# Patient Record
Sex: Female | Born: 1965 | Race: Black or African American | Hispanic: No | Smoking: Never smoker
Health system: Southern US, Community
[De-identification: ages and names within clinical notes are randomized; demographics above are authoritative.]

## PROBLEM LIST (undated history)

## (undated) DIAGNOSIS — T7840XA Allergy, unspecified, initial encounter: Secondary | ICD-10-CM

## (undated) DIAGNOSIS — R7303 Prediabetes: Secondary | ICD-10-CM

## (undated) DIAGNOSIS — E7800 Pure hypercholesterolemia, unspecified: Secondary | ICD-10-CM

## (undated) DIAGNOSIS — B9681 Helicobacter pylori [H. pylori] as the cause of diseases classified elsewhere: Secondary | ICD-10-CM

## (undated) DIAGNOSIS — D649 Anemia, unspecified: Secondary | ICD-10-CM

## (undated) DIAGNOSIS — M2550 Pain in unspecified joint: Secondary | ICD-10-CM

## (undated) DIAGNOSIS — K279 Peptic ulcer, site unspecified, unspecified as acute or chronic, without hemorrhage or perforation: Secondary | ICD-10-CM

## (undated) DIAGNOSIS — G629 Polyneuropathy, unspecified: Secondary | ICD-10-CM

## (undated) HISTORY — DX: Pure hypercholesterolemia, unspecified: E78.00

## (undated) HISTORY — DX: Pain in unspecified joint: M25.50

## (undated) HISTORY — DX: Anemia, unspecified: D64.9

## (undated) HISTORY — DX: Allergy, unspecified, initial encounter: T78.40XA

## (undated) HISTORY — DX: Polyneuropathy, unspecified: G62.9

## (undated) HISTORY — DX: Prediabetes: R73.03

---

## 1998-01-21 ENCOUNTER — Inpatient Hospital Stay (HOSPITAL_COMMUNITY): Admission: AD | Admit: 1998-01-21 | Discharge: 1998-01-21 | Payer: Self-pay | Admitting: *Deleted

## 1998-01-24 ENCOUNTER — Inpatient Hospital Stay (HOSPITAL_COMMUNITY): Admission: AD | Admit: 1998-01-24 | Discharge: 1998-01-24 | Payer: Self-pay | Admitting: *Deleted

## 1998-02-07 ENCOUNTER — Inpatient Hospital Stay (HOSPITAL_COMMUNITY): Admission: AD | Admit: 1998-02-07 | Discharge: 1998-02-07 | Payer: Self-pay | Admitting: *Deleted

## 1998-09-24 ENCOUNTER — Inpatient Hospital Stay (HOSPITAL_COMMUNITY): Admission: AD | Admit: 1998-09-24 | Discharge: 1998-09-24 | Payer: Self-pay | Admitting: *Deleted

## 1998-09-26 ENCOUNTER — Emergency Department (HOSPITAL_COMMUNITY): Admission: EM | Admit: 1998-09-26 | Discharge: 1998-09-27 | Payer: Self-pay

## 1998-12-14 ENCOUNTER — Emergency Department (HOSPITAL_COMMUNITY): Admission: EM | Admit: 1998-12-14 | Discharge: 1998-12-14 | Payer: Self-pay | Admitting: Emergency Medicine

## 1998-12-14 ENCOUNTER — Encounter: Payer: Self-pay | Admitting: Emergency Medicine

## 1999-04-03 ENCOUNTER — Encounter: Payer: Self-pay | Admitting: Emergency Medicine

## 1999-04-03 ENCOUNTER — Emergency Department (HOSPITAL_COMMUNITY): Admission: EM | Admit: 1999-04-03 | Discharge: 1999-04-03 | Payer: Self-pay | Admitting: Emergency Medicine

## 2002-10-24 ENCOUNTER — Emergency Department (HOSPITAL_COMMUNITY): Admission: EM | Admit: 2002-10-24 | Discharge: 2002-10-24 | Payer: Self-pay | Admitting: Emergency Medicine

## 2002-10-24 ENCOUNTER — Encounter: Payer: Self-pay | Admitting: Emergency Medicine

## 2006-06-24 ENCOUNTER — Ambulatory Visit: Payer: Self-pay | Admitting: Nurse Practitioner

## 2006-06-28 ENCOUNTER — Ambulatory Visit: Payer: Self-pay | Admitting: Nurse Practitioner

## 2006-07-01 ENCOUNTER — Ambulatory Visit (HOSPITAL_COMMUNITY): Admission: RE | Admit: 2006-07-01 | Discharge: 2006-07-01 | Payer: Self-pay | Admitting: Family Medicine

## 2006-07-11 ENCOUNTER — Ambulatory Visit: Payer: Self-pay | Admitting: *Deleted

## 2006-11-15 ENCOUNTER — Ambulatory Visit: Payer: Self-pay | Admitting: Nurse Practitioner

## 2006-12-01 ENCOUNTER — Ambulatory Visit: Payer: Self-pay | Admitting: Nurse Practitioner

## 2007-07-19 ENCOUNTER — Encounter (INDEPENDENT_AMBULATORY_CARE_PROVIDER_SITE_OTHER): Payer: Self-pay | Admitting: *Deleted

## 2007-11-07 ENCOUNTER — Inpatient Hospital Stay (HOSPITAL_COMMUNITY): Admission: AD | Admit: 2007-11-07 | Discharge: 2007-11-08 | Payer: Self-pay | Admitting: Obstetrics & Gynecology

## 2007-11-15 ENCOUNTER — Ambulatory Visit (HOSPITAL_COMMUNITY): Admission: RE | Admit: 2007-11-15 | Discharge: 2007-11-15 | Payer: Self-pay | Admitting: *Deleted

## 2007-11-15 ENCOUNTER — Encounter (INDEPENDENT_AMBULATORY_CARE_PROVIDER_SITE_OTHER): Payer: Self-pay | Admitting: *Deleted

## 2008-08-06 ENCOUNTER — Encounter (INDEPENDENT_AMBULATORY_CARE_PROVIDER_SITE_OTHER): Payer: Self-pay | Admitting: Nurse Practitioner

## 2008-08-06 ENCOUNTER — Ambulatory Visit: Payer: Self-pay | Admitting: Internal Medicine

## 2008-08-06 LAB — CONVERTED CEMR LAB
ALT: 18 units/L (ref 0–35)
Albumin: 4.8 g/dL (ref 3.5–5.2)
Barbiturate Quant, Ur: NEGATIVE
Benzodiazepines.: NEGATIVE
CO2: 22 meq/L (ref 19–32)
Chloride: 103 meq/L (ref 96–112)
Creatinine, Ser: 1.01 mg/dL (ref 0.40–1.20)
Creatinine,U: 209.3 mg/dL
Eosinophils Absolute: 0.1 10*3/uL (ref 0.0–0.7)
Glucose, Bld: 75 mg/dL (ref 70–99)
HDL: 57 mg/dL (ref >39)
LDL Cholesterol: 189 mg/dL — ABNORMAL HIGH (ref 0–99)
Lymphocytes Relative: 37 % (ref 12–46)
Methadone: NEGATIVE
Monocytes Absolute: 0.6 10*3/uL (ref 0.1–1.0)
Neutro Abs: 3 10*3/uL (ref 1.7–7.7)
Neutrophils Relative %: 50 % (ref 43–77)
Potassium: 4.2 meq/L (ref 3.5–5.3)
Propoxyphene: NEGATIVE
RDW: 13.8 % (ref 11.5–15.5)
TSH: 0.874 microintl units/mL (ref 0.350–4.50)
Total Bilirubin: 0.4 mg/dL (ref 0.3–1.2)
Total CHOL/HDL Ratio: 4.7
Triglycerides: 113 mg/dL (ref <150)
VLDL: 23 mg/dL (ref 0–40)
WBC: 6 10*3/uL (ref 4.0–10.5)

## 2008-08-07 ENCOUNTER — Encounter (INDEPENDENT_AMBULATORY_CARE_PROVIDER_SITE_OTHER): Payer: Self-pay | Admitting: Nurse Practitioner

## 2009-01-16 ENCOUNTER — Ambulatory Visit: Payer: Self-pay | Admitting: Internal Medicine

## 2009-03-17 ENCOUNTER — Ambulatory Visit: Payer: Self-pay | Admitting: Internal Medicine

## 2009-06-04 ENCOUNTER — Encounter (INDEPENDENT_AMBULATORY_CARE_PROVIDER_SITE_OTHER): Payer: Self-pay | Admitting: Internal Medicine

## 2009-06-04 ENCOUNTER — Ambulatory Visit: Payer: Self-pay | Admitting: Internal Medicine

## 2009-06-04 LAB — CONVERTED CEMR LAB
ALT: 13 units/L (ref 0–35)
BUN: 10 mg/dL (ref 6–23)
CO2: 23 meq/L (ref 19–32)
Creatinine, Ser: 1 mg/dL (ref 0.40–1.20)
Glucose, Bld: 78 mg/dL (ref 70–99)
LDL Cholesterol: 164 mg/dL — ABNORMAL HIGH (ref 0–99)
Potassium: 3.6 meq/L (ref 3.5–5.3)
Total Protein: 7.4 g/dL (ref 6.0–8.3)
Triglycerides: 96 mg/dL (ref <150)
VLDL: 19 mg/dL (ref 0–40)

## 2009-07-15 ENCOUNTER — Ambulatory Visit: Payer: Self-pay | Admitting: Family Medicine

## 2009-07-15 ENCOUNTER — Encounter (INDEPENDENT_AMBULATORY_CARE_PROVIDER_SITE_OTHER): Payer: Self-pay | Admitting: Internal Medicine

## 2009-07-15 LAB — CONVERTED CEMR LAB
Basophils Absolute: 0.1 10*3/uL (ref 0.0–0.1)
Basophils Relative: 1 % (ref 0–1)
HCT: 35.6 % — ABNORMAL LOW (ref 36.0–46.0)
Hemoglobin: 11 g/dL — ABNORMAL LOW (ref 12.0–15.0)
Lymphs Abs: 2.5 10*3/uL (ref 0.7–4.0)
MCV: 80.5 fL (ref 78.0–100.0)
Monocytes Absolute: 0.9 10*3/uL (ref 0.1–1.0)
Neutro Abs: 3.9 10*3/uL (ref 1.7–7.7)
Vit D, 25-Hydroxy: 12 ng/mL — ABNORMAL LOW (ref 30–89)
WBC: 7.3 10*3/uL (ref 4.0–10.5)

## 2009-07-17 ENCOUNTER — Ambulatory Visit: Payer: Self-pay | Admitting: Internal Medicine

## 2009-08-04 ENCOUNTER — Encounter (INDEPENDENT_AMBULATORY_CARE_PROVIDER_SITE_OTHER): Payer: Self-pay | Admitting: Internal Medicine

## 2009-08-04 ENCOUNTER — Ambulatory Visit: Payer: Self-pay | Admitting: Internal Medicine

## 2009-08-04 LAB — CONVERTED CEMR LAB
AST: 19 units/L (ref 0–37)
Albumin: 4.5 g/dL (ref 3.5–5.2)
Alkaline Phosphatase: 51 units/L (ref 39–117)
BUN: 11 mg/dL (ref 6–23)
HDL: 52 mg/dL (ref >39)
LDL Cholesterol: 130 mg/dL — ABNORMAL HIGH (ref 0–99)
Potassium: 4.1 meq/L (ref 3.5–5.3)
Sodium: 139 meq/L (ref 135–145)
Total Bilirubin: 0.3 mg/dL (ref 0.3–1.2)
Total CHOL/HDL Ratio: 4
Triglycerides: 125 mg/dL (ref <150)

## 2009-08-27 ENCOUNTER — Telehealth (INDEPENDENT_AMBULATORY_CARE_PROVIDER_SITE_OTHER): Payer: Self-pay | Admitting: *Deleted

## 2009-08-27 ENCOUNTER — Ambulatory Visit: Payer: Self-pay | Admitting: Internal Medicine

## 2009-09-12 ENCOUNTER — Encounter (INDEPENDENT_AMBULATORY_CARE_PROVIDER_SITE_OTHER): Payer: Self-pay | Admitting: Internal Medicine

## 2009-09-12 ENCOUNTER — Ambulatory Visit: Payer: Self-pay | Admitting: Internal Medicine

## 2009-09-12 ENCOUNTER — Ambulatory Visit (HOSPITAL_COMMUNITY): Admission: RE | Admit: 2009-09-12 | Discharge: 2009-09-12 | Payer: Self-pay | Admitting: Internal Medicine

## 2009-09-12 LAB — CONVERTED CEMR LAB
Basophils Absolute: 0.1 10*3/uL (ref 0.0–0.1)
Eosinophils Relative: 2 % (ref 0–5)
HCT: 36.6 % (ref 36.0–46.0)
Lymphocytes Relative: 19 % (ref 12–46)
Lymphs Abs: 1.4 10*3/uL (ref 0.7–4.0)
Monocytes Absolute: 1.2 10*3/uL — ABNORMAL HIGH (ref 0.1–1.0)
Neutro Abs: 4.8 10*3/uL (ref 1.7–7.7)
RBC: 4.6 M/uL (ref 3.87–5.11)
RDW: 17.5 % — ABNORMAL HIGH (ref 11.5–15.5)
WBC: 7.6 10*3/uL (ref 4.0–10.5)

## 2009-12-03 ENCOUNTER — Emergency Department (HOSPITAL_COMMUNITY): Admission: EM | Admit: 2009-12-03 | Discharge: 2009-12-03 | Payer: Self-pay | Admitting: Emergency Medicine

## 2010-01-02 ENCOUNTER — Ambulatory Visit: Payer: Self-pay | Admitting: Family Medicine

## 2010-01-05 ENCOUNTER — Ambulatory Visit (HOSPITAL_COMMUNITY): Admission: RE | Admit: 2010-01-05 | Discharge: 2010-01-05 | Payer: Self-pay | Admitting: Family Medicine

## 2010-04-10 ENCOUNTER — Ambulatory Visit: Payer: Self-pay | Admitting: Family Medicine

## 2010-05-13 ENCOUNTER — Encounter (INDEPENDENT_AMBULATORY_CARE_PROVIDER_SITE_OTHER): Payer: Self-pay | Admitting: Adult Health

## 2010-05-13 ENCOUNTER — Ambulatory Visit: Payer: Self-pay | Admitting: Internal Medicine

## 2010-05-13 LAB — CONVERTED CEMR LAB
ALT: 15 units/L (ref 0–35)
BUN: 8 mg/dL (ref 6–23)
Basophils Absolute: 0.1 10*3/uL (ref 0.0–0.1)
CO2: 23 meq/L (ref 19–32)
Chloride: 106 meq/L (ref 96–112)
Cholesterol: 243 mg/dL — ABNORMAL HIGH (ref 0–200)
Creatinine, Ser: 1 mg/dL (ref 0.40–1.20)
Eosinophils Relative: 2 % (ref 0–5)
Glucose, Bld: 77 mg/dL (ref 70–99)
HCT: 34.8 % — ABNORMAL LOW (ref 36.0–46.0)
Hemoglobin: 10.8 g/dL — ABNORMAL LOW (ref 12.0–15.0)
LDL Cholesterol: 158 mg/dL — ABNORMAL HIGH (ref 0–99)
Lymphocytes Relative: 41 % (ref 12–46)
Lymphs Abs: 1.9 10*3/uL (ref 0.7–4.0)
Monocytes Absolute: 0.5 10*3/uL (ref 0.1–1.0)
Monocytes Relative: 11 % (ref 3–12)
Neutrophils Relative %: 45 % (ref 43–77)
Platelets: 417 10*3/uL — ABNORMAL HIGH (ref 150–400)
RDW: 15.4 % (ref 11.5–15.5)
Total Bilirubin: 0.3 mg/dL (ref 0.3–1.2)
Total CHOL/HDL Ratio: 4
Total Protein: 7.3 g/dL (ref 6.0–8.3)
Triglycerides: 122 mg/dL (ref <150)

## 2010-06-19 ENCOUNTER — Emergency Department (HOSPITAL_COMMUNITY): Admission: EM | Admit: 2010-06-19 | Discharge: 2010-06-20 | Payer: Self-pay | Admitting: Emergency Medicine

## 2010-06-26 ENCOUNTER — Ambulatory Visit: Payer: Self-pay | Admitting: Internal Medicine

## 2010-06-26 LAB — CONVERTED CEMR LAB: Helicobacter Pylori Antibody-IgG: 7.1 — ABNORMAL HIGH

## 2010-07-10 ENCOUNTER — Encounter (INDEPENDENT_AMBULATORY_CARE_PROVIDER_SITE_OTHER): Payer: Self-pay | Admitting: Family Medicine

## 2010-07-10 ENCOUNTER — Ambulatory Visit: Payer: Self-pay | Admitting: Adult Health

## 2010-07-10 LAB — CONVERTED CEMR LAB
ALT: 23 units/L (ref 0–35)
Alkaline Phosphatase: 45 units/L (ref 39–117)
BUN: 9 mg/dL (ref 6–23)
Chloride: 101 meq/L (ref 96–112)
Creatinine, Ser: 0.98 mg/dL (ref 0.40–1.20)
HDL: 66 mg/dL (ref >39)
LDL Cholesterol: 67 mg/dL (ref 0–99)
Total Bilirubin: 0.4 mg/dL (ref 0.3–1.2)
Triglycerides: 93 mg/dL (ref <150)

## 2010-11-22 ENCOUNTER — Encounter: Payer: Self-pay | Admitting: *Deleted

## 2011-01-15 LAB — CBC
Hemoglobin: 14 g/dL (ref 12.0–15.0)
MCH: 26.5 pg (ref 26.0–34.0)
MCV: 81.7 fL (ref 78.0–100.0)
Platelets: 336 10*3/uL (ref 150–400)
RBC: 5.28 MIL/uL — ABNORMAL HIGH (ref 3.87–5.11)
WBC: 12 10*3/uL — ABNORMAL HIGH (ref 4.0–10.5)

## 2011-01-15 LAB — FECAL LACTOFERRIN, QUANT

## 2011-01-15 LAB — BASIC METABOLIC PANEL
Calcium: 9.6 mg/dL (ref 8.4–10.5)
Creatinine, Ser: 1 mg/dL (ref 0.4–1.2)
GFR calc Af Amer: >60 mL/min (ref >60)

## 2011-01-15 LAB — DIFFERENTIAL
Basophils Absolute: 0.1 10*3/uL (ref 0.0–0.1)
Eosinophils Absolute: 0 10*3/uL (ref 0.0–0.7)
Eosinophils Relative: 0 % (ref 0–5)
Lymphs Abs: 1.4 10*3/uL (ref 0.7–4.0)
Monocytes Absolute: 0.6 10*3/uL (ref 0.1–1.0)
Monocytes Relative: 5 % (ref 3–12)

## 2011-01-15 LAB — CLOSTRIDIUM DIFFICILE EIA: C difficile Toxins A+B, EIA: NEGATIVE

## 2011-01-15 LAB — PREGNANCY, URINE: Preg Test, Ur: NEGATIVE

## 2011-01-15 LAB — STOOL CULTURE

## 2011-01-15 LAB — OVA AND PARASITE EXAMINATION

## 2011-01-15 LAB — HEPATIC FUNCTION PANEL: Albumin: 4.8 g/dL (ref 3.5–5.2)

## 2011-03-16 NOTE — Op Note (Signed)
Cheryl Graham, Cheryl Graham            ACCOUNT NO.:  [id number]   MEDICAL RECORD NO.:  [id number]          PATIENT TYPE:  AMB   LOCATION:  SDC                           FACILITY:  [facility]   PHYSICIAN:  Almedia Balls. Fore, M.D.   DATE OF BIRTH:  October 12, 1966   DATE OF PROCEDURE:  11/15/2007  DATE OF DISCHARGE:                               OPERATIVE REPORT   PREOPERATIVE DIAGNOSIS:  Vaginal septum, abnormal uterine bleeding.   POSTOPERATIVE DIAGNOSIS:  Vaginal septum, abnormal uterine bleeding,  pending pathology.   OPERATION:  Excision of vaginal septum, D&C.   ANESTHESIA:  General orotracheal plus 10 mL 1% lidocaine with 1:100,000  epinephrine local and paracervical block.   INDICATIONS FOR SURGERY:  The patient is a 45 year old with the above-  noted problems who was counseled as to the need for surgery to treat  these problems and the type of procedures to be performed.  She was also  counseled fully as to the risks involved to include risks of anesthesia,  injury to uterus, tubes, ovaries, bowel, bladder, blood vessels,  ureters, postoperative hemorrhage, infection, recuperation.  She fully  understands all these considerations and has signed informed consent to  proceed on November 15, 2007.   OPERATIVE FINDINGS:  On speculum exam, there was noted to be of vaginal  septum extending from the midportion of the upper vagina over to the  left side which completely obscured the cervix.  The cervix was likewise  in an unusual position with very firm connective tissue directing it in  a posterior direction.  The uterus sounded to 7.5 cm.  It was not  possible to perform hysteroscopy because of the positioning of the  cervix and the inability to insert the hysteroscope through the cervix.   PROCEDURE:  With the patient under general anesthesia, prepared and  draped in the usual sterile fashion, speculum was placed in the vagina.  The septum was grasped with an Allis clamp, and a solution of 1%  lidocaine with 1:100,000 epinephrine.  It was injected at the base and  around the septum.  This area was then excised and then the open defect  was suture ligated open rendering it hemostatic but with release of the  vagina in this area.  It was quite difficult to place a tenaculum on the  cervix, but this was done after much manipulation of the upper vaginal  tissue which was quite firm.  The cervix was then elevated slightly to  the point where a sound and dilating could be performed on the cervix.  An endometrial curetting was done with a small sharp curette with  moderate amount of tissue being obtained.  At this point, the area of  the septum was treated with Monsel's solution and observed for several  minutes to ensure that all was hemostatic.  After noting that this was  the case and that sponge and instrument counts were correct, the  procedure was terminated.   ESTIMATED BLOOD LOSS:  50 mL.   The patient was taken to the recovery room in good condition following  catheterization and free flow of clear  urine.   FOLLOW-UP CARE:  She is to return to the office in two weeks for follow-  up and to call if heavy bleeding, pain or unexplained fever should  ensue.  She was given a prescription for doxycycline 100 mg #12 to be  taken one b.i.d. and was advised to use over-the-counter medications  such as Advil or Tylenol for pain.           ______________________________  Almedia Balls. Randell Patient, M.D.     SRF/MEDQ  D:  11/15/2007  T:  11/15/2007  Job:  [id number]

## 2011-07-22 LAB — GC/CHLAMYDIA PROBE AMP, GENITAL
Chlamydia, DNA Probe: NEGATIVE
GC Probe Amp, Genital: NEGATIVE

## 2011-07-22 LAB — CBC
Hemoglobin: 11.8 — ABNORMAL LOW
MCHC: 34
MCV: 83.5
Platelets: 377
Platelets: 389
RDW: 16.8 — ABNORMAL HIGH
WBC: 5.3
WBC: 7.7

## 2011-07-22 LAB — WET PREP, GENITAL: Clue Cells Wet Prep HPF POC: NONE SEEN

## 2013-06-26 ENCOUNTER — Encounter (HOSPITAL_COMMUNITY): Payer: Self-pay | Attending: Hematology and Oncology

## 2013-06-26 ENCOUNTER — Encounter (HOSPITAL_COMMUNITY): Payer: Self-pay

## 2013-06-26 VITALS — BP 109/76 | HR 75 | Temp 97.9°F | Resp 16 | Ht 62.5 in | Wt 138.3 lb

## 2013-06-26 DIAGNOSIS — D75839 Thrombocytosis, unspecified: Secondary | ICD-10-CM

## 2013-06-26 DIAGNOSIS — D649 Anemia, unspecified: Secondary | ICD-10-CM

## 2013-06-26 DIAGNOSIS — N920 Excessive and frequent menstruation with regular cycle: Secondary | ICD-10-CM

## 2013-06-26 DIAGNOSIS — D473 Essential (hemorrhagic) thrombocythemia: Secondary | ICD-10-CM

## 2013-06-26 LAB — CBC WITH DIFFERENTIAL/PLATELET
HCT: 34.1 % — ABNORMAL LOW (ref 36.0–46.0)
Hemoglobin: 10.6 g/dL — ABNORMAL LOW (ref 12.0–15.0)
Lymphs Abs: 2.2 10*3/uL (ref 0.7–4.0)
Monocytes Absolute: 0.4 10*3/uL (ref 0.1–1.0)
Monocytes Relative: 8 % (ref 3–12)
Neutro Abs: 2.8 10*3/uL (ref 1.7–7.7)
Neutrophils Relative %: 50 % (ref 43–77)
RBC: 4.62 MIL/uL (ref 3.87–5.11)

## 2013-06-26 LAB — COMPREHENSIVE METABOLIC PANEL
Albumin: 4 g/dL (ref 3.5–5.2)
Alkaline Phosphatase: 57 U/L (ref 39–117)
BUN: 10 mg/dL (ref 6–23)
CO2: 28 mEq/L (ref 19–32)
Chloride: 97 mEq/L (ref 96–112)
GFR calc Af Amer: 77 mL/min — ABNORMAL LOW (ref >90)
GFR calc non Af Amer: 66 mL/min — ABNORMAL LOW (ref >90)
Glucose, Bld: 107 mg/dL — ABNORMAL HIGH (ref 70–99)
Potassium: 3.5 mEq/L (ref 3.5–5.1)
Total Bilirubin: 0.2 mg/dL — ABNORMAL LOW (ref 0.3–1.2)

## 2013-06-26 LAB — IRON AND TIBC
Iron: 33 ug/dL — ABNORMAL LOW (ref 42–135)
UIBC: 434 ug/dL — ABNORMAL HIGH (ref 125–400)

## 2013-06-26 LAB — LACTATE DEHYDROGENASE: LDH: 202 U/L (ref 94–250)

## 2013-06-26 NOTE — Progress Notes (Addendum)
Patient History and Physical   Cheryl Graham [id number] Mar 12, 1966 47 y.o. 06/26/2013  Referring IO:[id number] Katherene Ponto PA-C  Chief Complaint: Anemia and elevated platelet count   HPI:  Cheryl Graham is a 47 year old woman with history of heavy menstrual bleed which dates back to at the least 2007. She had pelvic and vaginal ultrasound on 07/01/2006 which showed normal-appearing uterus with normal endometrium in addition to normal ovaries.  There was no report of fibroid. On 11/15/2007 patient that the excision of vaginal septum/D&C for abnormal uterine bleeding. I do not have any iron study result at this time in her available records. Review of her paper records and especially historical CBC results shows more recently on 06/01/2013 hemoglobin was noted to be 9.6 with platelet count of 761K.  In January of 2014 hemoglobin was 9.9 with platelets of 510 K.  In April of 2013 hemoglobin was again no 9.9 with normal platelet count 344K, at all times  WBC has remained normal.  She reports that she is taking iron but was not able to tell me the strength of the pill that she takes it at a rate of one tablet a day and has not been recently taking it due to complaints of constipation.  She also reports irregular menstrual periods . Her last menstrual period was in June 2014.  She states that ,usually when she does have her periods, it  can last up to 2 weeks with clots and particularly even in the first 3 days.  She feels very tired but denied any craving for ice or shortness of breath.  She denies any black stool or bloody stool.  She did state that sometimes she feels cold.  PMH: Past Medical History  Diagnosis Date  . Anemia   . Hypercholesteremia     History reviewed. No pertinent past surgical history.  Allergies: Allergies no known allergies  Medications: Current outpatient prescriptions:acetaminophen (TYLENOL) 500 MG tablet, Take 500 mg by mouth as needed for pain., Disp: , Rfl: ;   ferrous sulfate 324 (65 FE) MG TBEC, Take 1 tablet by mouth daily., Disp: , Rfl: ;  ibuprofen (ADVIL,MOTRIN) 200 MG tablet, Take 200 mg by mouth as needed for pain., Disp: , Rfl: ;  pseudoephedrine-acetaminophen (TYLENOL SINUS) 30-500 MG TABS, Take 1 tablet by mouth as needed., Disp: , Rfl:  simvastatin (ZOCOR) 20 MG tablet, Take 20 mg by mouth every evening., Disp: , Rfl:    Social History:   reports that she has never smoked. She has never used smokeless tobacco. She reports that she does not drink alcohol or use illicit drugs.  Works as Water engineer. Single. No children. Lives alone.  Family History: Family History  Problem Relation Age of Onset  . Stroke Mother   . Diabetes Father   Denies any family history of  hematologic condition. Cousin had lupus.  Review of Systems: 14 point review of system is as in the history above otherwise negative.  Physical Exam: Blood pressure 109/76, pulse 75, temperature 97.9 F (36.6 C), temperature source Oral, resp. rate 16, height 5' 2.5" (1.588 m), weight 138 lb 4.8 oz (62.732 kg). GENERAL: No distress, .  SKIN:  No rashes or significant lesions , no ecchymosis or petechia or rash. HEAD: Normocephalic, No masses, lesions, tenderness or abnormalities  EYES: Conjunctiva are pink , non-injected and no jaundice. LYMPH: No palpable lymphadenopathy,  In the neck supraclavicular area or axilla. LUNGS: Clear to auscultation , no crackles or wheezes HEART: regular rate &  rhythm, no murmurs, no gallops, S1 normal and S2 normal  ABDOMEN: Abdomen soft, non-tender, normal bowel sounds, no masses or organomegaly and no hepatosplenomegaly palpable.  EXTREMITIES: No edema, no skin discoloration or tenderness.      Lab Results: Lab Results  Component Value Date   WBC 12.0* 06/19/2010   HGB 14.0 06/19/2010   HCT 43.1 06/19/2010   MCV 81.7 06/19/2010   PLT 336 06/19/2010     Chemistry      Component Value Date/Time   NA 139 07/10/2010 1927   K 5.0  07/10/2010 1927   CL 101 07/10/2010 1927   CO2 24 07/10/2010 1927   BUN 9 07/10/2010 1927   CREATININE 0.98 07/10/2010 1927      Component Value Date/Time   CALCIUM 9.6 07/10/2010 1927   ALKPHOS 45 07/10/2010 1927   AST 34 07/10/2010 1927   ALT 23 07/10/2010 1927   BILITOT 0.4 07/10/2010 1927      Impression: Cheryl Graham has features suggestive of iron deficiency anemia.  However this needs confirmation.  Thrombocytosis most likely is from iron deficiency.  I suspect  from her history the patient has a GYN related blood loss.  It's been several years since the patient's last  vaginal ultrasound and I think that a repeat will be reasonable if patient is established to be iron deficient. I had a very long discussion with her and we discussed the causes of iron deficiency including but not limited to GYN/ GI causes.  Thrombocytosis most likely secondary to iron deficiency and unless this does not improve or resolve with correction of iron deficiency if established, will further work up in this regard be warranted.   Recommendations: 1. CBC/CMP/iron studies today. 2.  I will review peripheral blood smear. 3.  We will call and schedule patient for IV iron if indicated based on today's blood work since she is unable to tolerate oral iron. 4.  She'll return to clinic in 2 weeks to discuss today's results and possibly at that time discuss further evaluation in terms of etiology of iron deficiency (if established by today's blood work)     All questions were satisfactorily answered.She knows to call if she has any concern.  I spent 50% of the time was spent counseling the patient face to face. The total time spent in the appointment was 60 minutes.   Sherral Hammers, MD FACP. Hematology/Oncology.    Addendum: Review of peripheral blood smear showed hypochromia, and anisopoikilocytosis, microcytosis and mature neutrophils. No  Blast forms seen.  Marland Kitchen

## 2013-06-26 NOTE — Patient Instructions (Addendum)
[facility] Discharge Instructions  RECOMMENDATIONS MADE BY THE CONSULTANT AND ANY TEST RESULTS WILL BE SENT TO YOUR REFERRING PHYSICIAN.  Lab work today. We will call you if you are in need of an Iron infusion. Return to clinic in 2 weeks to see MD to review all lab results.  Thank you for choosing [facility] to provide your oncology and hematology care.  To afford each patient quality time with our providers, please arrive at least 15 minutes before your scheduled appointment time.  With your help, our goal is to use those 15 minutes to complete the necessary work-up to ensure our physicians have the information they need to help with your evaluation and healthcare recommendations.    Effective January 1st, 2014, we ask that you re-schedule your appointment with our physicians should you arrive 10 or more minutes late for your appointment.  We strive to give you quality time with our providers, and arriving late affects you and other patients whose appointments are after yours.    Again, thank you for choosing [facility].  Our hope is that these requests will decrease the amount of time that you wait before being seen by our physicians.       _____________________________________________________________  Should you have questions after your visit to [facility], please contact our office at [phone] between the hours of 8:30 a.m. and 5:00 p.m.  Voicemails left after 4:30 p.m. will not be returned until the following business day.  For prescription refill requests, have your pharmacy contact our office with your prescription refill request.

## 2013-06-28 ENCOUNTER — Encounter (HOSPITAL_BASED_OUTPATIENT_CLINIC_OR_DEPARTMENT_OTHER): Payer: Self-pay

## 2013-06-28 VITALS — BP 116/80 | HR 85 | Temp 98.4°F | Resp 18

## 2013-06-28 DIAGNOSIS — N920 Excessive and frequent menstruation with regular cycle: Secondary | ICD-10-CM

## 2013-06-28 DIAGNOSIS — D509 Iron deficiency anemia, unspecified: Secondary | ICD-10-CM

## 2013-06-28 DIAGNOSIS — D649 Anemia, unspecified: Secondary | ICD-10-CM

## 2013-06-28 MED ORDER — FERUMOXYTOL INJECTION 510 MG/17 ML
1020.0000 mg | Freq: Once | INTRAVENOUS | Status: AC
  Administered 2013-06-28: 1020 mg via INTRAVENOUS
  Filled 2013-06-28: qty 34

## 2013-06-28 MED ORDER — SODIUM CHLORIDE 0.9 % IV SOLN
Freq: Once | INTRAVENOUS | Status: AC
  Administered 2013-06-28: 11:00:00 via INTRAVENOUS

## 2013-06-28 NOTE — Progress Notes (Signed)
Tolerated well

## 2013-07-09 ENCOUNTER — Encounter (HOSPITAL_COMMUNITY): Payer: Self-pay | Attending: Hematology and Oncology

## 2013-07-09 DIAGNOSIS — D509 Iron deficiency anemia, unspecified: Secondary | ICD-10-CM | POA: Insufficient documentation

## 2013-07-09 DIAGNOSIS — D649 Anemia, unspecified: Secondary | ICD-10-CM

## 2013-07-09 DIAGNOSIS — D75839 Thrombocytosis, unspecified: Secondary | ICD-10-CM

## 2013-07-09 DIAGNOSIS — D473 Essential (hemorrhagic) thrombocythemia: Secondary | ICD-10-CM | POA: Insufficient documentation

## 2013-07-09 LAB — CBC WITH DIFFERENTIAL/PLATELET
Basophils Absolute: 0.1 10*3/uL (ref 0.0–0.1)
HCT: 35.5 % — ABNORMAL LOW (ref 36.0–46.0)
Lymphocytes Relative: 39 % (ref 12–46)
Monocytes Absolute: 0.7 10*3/uL (ref 0.1–1.0)
Neutro Abs: 3.2 10*3/uL (ref 1.7–7.7)
RBC: 4.71 MIL/uL (ref 3.87–5.11)
RDW: 22.4 % — ABNORMAL HIGH (ref 11.5–15.5)
WBC: 6.5 10*3/uL (ref 4.0–10.5)

## 2013-07-09 LAB — IRON AND TIBC
Iron: 298 ug/dL — ABNORMAL HIGH (ref 42–135)
Saturation Ratios: 75 % — ABNORMAL HIGH (ref 20–55)
UIBC: 100 ug/dL — ABNORMAL LOW (ref 125–400)

## 2013-07-09 NOTE — Progress Notes (Signed)
Labs drawn today for cbc/diff,Iron and IBC,ferr 

## 2013-07-10 ENCOUNTER — Ambulatory Visit (HOSPITAL_COMMUNITY): Payer: Self-pay

## 2013-07-12 ENCOUNTER — Encounter (HOSPITAL_BASED_OUTPATIENT_CLINIC_OR_DEPARTMENT_OTHER): Payer: Self-pay

## 2013-07-12 ENCOUNTER — Encounter (HOSPITAL_COMMUNITY): Payer: Self-pay

## 2013-07-12 VITALS — BP 99/67 | HR 87 | Temp 98.4°F | Resp 16 | Wt 136.7 lb

## 2013-07-12 DIAGNOSIS — R109 Unspecified abdominal pain: Secondary | ICD-10-CM

## 2013-07-12 DIAGNOSIS — D509 Iron deficiency anemia, unspecified: Secondary | ICD-10-CM

## 2013-07-12 DIAGNOSIS — N920 Excessive and frequent menstruation with regular cycle: Secondary | ICD-10-CM

## 2013-07-12 NOTE — Progress Notes (Signed)
[facility] [phone]   [phone]  OFFICE PROGRESS NOTE  No primary provider on file. No primary provider on file.  DIAGNOSIS: Iron deficiency anemia.  INTERVAL HISTORY:   Cheryl Graham is a 47 year old woman with history of heavy menstrual bleed which dates back to at the least 2007. She had pelvic and vaginal ultrasound on 07/01/2006 which showed normal-appearing uterus with normal endometrium in addition to normal ovaries. There was no report of fibroid. On 11/15/2007 patient that the excision of vaginal septum/D&C for abnormal uterine bleeding. Following her initial visit, review of peripheral blood smear showed hypochromia, and anisopoikilocytosis, microcytosis and mature neutrophils. No Blast forms seen. Ferritin was 54 and TIBC was elevated at 467 with saturation of 7%. This picture was consistent with iron deficiency anemia. As a result I ordered IV iron which was administered on 06/28/2013.  Since receiving IV iron, she tells me that she's been feeling better and less fatigued. She returns to the clinic today for scheduled follow up discuss and initiate her results.  She reports that she's been having nausea or vomiting [place] to more pain since the past Monday.  She also reports muscle aches and has been noticing these symptoms since she began a new cholesterol medication (simvastatin) which she reportedly did not tolerate well in the past.   MEDICAL HISTORY: Past Medical History  Diagnosis Date  . Anemia   . Hypercholesteremia     ALLERGIES:  has No Known Allergies.  MEDICATIONS:  Current Outpatient Prescriptions  Medication Sig Dispense Refill  . acetaminophen (TYLENOL) 500 MG tablet Take 500 mg by mouth as needed for pain.      . ferrous sulfate 324 (65 FE) MG TBEC Take 1 tablet by mouth daily.      Marland Kitchen ibuprofen (ADVIL,MOTRIN) 200 MG tablet Take 200 mg by mouth as needed for pain.      . pseudoephedrine-acetaminophen (TYLENOL SINUS)  30-500 MG TABS Take 1 tablet by mouth as needed.      . simvastatin (ZOCOR) 20 MG tablet Take 20 mg by mouth every evening.       No current facility-administered medications for this visit.    SURGICAL HISTORY: History reviewed. No pertinent past surgical history.   REVIEW OF SYSTEMS:14 point review of system is as in the history above otherwise negative.  PHYSICAL EXAMINATION:  Blood pressure 99/67, pulse 87, temperature 98.4 F (36.9 C), temperature source Oral, resp. rate 16, weight 136 lb 11.2 oz (62.007 kg). GENERAL: No acute distress. EYES: Conjunctiva are pink and non-injected and no jaundice LUNGS: Clear to auscultation , no crackles or wheezes HEART: regular rate & rhythm, no murmurs, no gallops, S1 normal and S2 normal and no S3. ABDOMEN: Abdomen soft, no significant tenderness, no masses or organomegaly and no hepatosplenomegaly palpable EXTREMITIES: No edema, no skin discoloration or tenderness NEURO: Alert & oriented      LABORATORY DATA: Lab Results  Component Value Date   WBC 6.5 07/09/2013   HGB 11.4* 07/09/2013   HCT 35.5* 07/09/2013   MCV 75.4* 07/09/2013   PLT 368 07/09/2013  Results for Cheryl Graham, Cheryl Graham (MRN [id number]) as of 07/12/2013 18:35  Ref. Range 06/26/2013 12:22  WBC Latest Range: 4.0-10.5 K/uL 5.6  RBC Latest Range: 3.87-5.11 MIL/uL 4.62  Hemoglobin Latest Range: 12.0-15.0 g/dL 95.1 (L)  HCT Latest Range: 36.0-46.0 % 34.1 (L)  MCV Latest Range: 78.0-100.0 fL 73.8 (L)  MCH Latest Range: 26.0-34.0 pg 22.9 (L)  MCHC Latest  Range: 30.0-36.0 g/dL 16.1  RDW Latest Range: 11.5-15.5 % 22.6 (H)  Platelets Latest Range: 150-400 K/uL 396  Results for Cheryl Graham, Cheryl Graham (MRN [id number]) as of 07/12/2013 18:35Results for Cheryl Graham, Cheryl Graham (MRN [id number]) as of 07/12/2013 18:35  Ref. Range 06/26/2013 12:22  Iron Latest Range: 42-135 ug/dL 33 (L)  UIBC Latest Range: 125-400 ug/dL 956 (H)  TIBC Latest Range: 250-470 ug/dL 213  Saturation Ratios Latest Range: 20-55  % 7 (L)  Ferritin Latest Range: 10-291 ng/mL 54    Ref. Range 07/09/2013 09:49  Iron Latest Range: 42-135 ug/dL 086 (H)  UIBC Latest Range: 125-400 ug/dL 578 (L)  TIBC Latest Range: 250-470 ug/dL 469  Saturation Ratios Latest Range: 20-55 % 75 (H)  Ferritin Latest Range: 10-291 ng/mL 1607 (H)      Chemistry      Component Value Date/Time   NA 136 06/26/2013 1222   K 3.5 06/26/2013 1222   CL 97 06/26/2013 1222   CO2 28 06/26/2013 1222   BUN 10 06/26/2013 1222   CREATININE 1.01 06/26/2013 1222      Component Value Date/Time   CALCIUM 9.4 06/26/2013 1222   ALKPHOS 57 06/26/2013 1222   AST 20 06/26/2013 1222   ALT 14 06/26/2013 1222   BILITOT 0.2* 06/26/2013 1222       RADIOGRAPHIC STUDIES: No results found.   ASSESSMENT:  Iron deficiency anemia.  Patient is showing good response to iron infusion. She needs workup for etiology of iron deficiency. She has a complaint of abdominal symptoms and musculoskeletal pain which she attributes to her cholesterol medication which she reportedly did not tolerate well in the past.    Recommendations :  1. GYN referral for evaluation of GYN etiology of the heavy bleeding and iron deficiency. There is no strong evidence that point to GI blood loss at this time. If GYN cause is excluded and iron deficiency reoccurs , then GI evaluation with colonoscopy be reasonable. 2. Return to clinic in 3 months and we will repeat iron studies at that time. 3. Patient instructed to call her primary care provider and reports symptoms that she attributes to the cholesterol medication.  In the meantime I advised her to stop the medication until she get clearance from her primary care provider.     All questions were satisfactorily answered. Patient knows to call if  any concern arises.  I spent more than 50 % counseling the patient face to face. The total time spent in the appointment was 30 minutes.   Sherral Hammers, MD FACP. Hematology/Oncology.

## 2013-07-12 NOTE — Patient Instructions (Addendum)
[facility] Discharge Instructions  RECOMMENDATIONS MADE BY THE CONSULTANT AND ANY TEST RESULTS WILL BE SENT TO YOUR REFERRING PHYSICIAN.  EXAM FINDINGS BY THE PHYSICIAN TODAY AND SIGNS OR SYMPTOMS TO REPORT TO CLINIC OR PRIMARY PHYSICIAN: Exam and discussion by Dr. Sharia Reeve.  Your iron studies are improving.  MD recommends that you be seen by a gynecologist to make sure there is no GYN reason for the iron loss.  MEDICATIONS PRESCRIBED:  none  INSTRUCTIONS GIVEN AND DISCUSSED: Report increased fatique, increased ice intake, increased shortness of breath.  SPECIAL INSTRUCTIONS/FOLLOW-UP: Follow-up in 3 months with blood work and follow-up.  Thank you for choosing [facility] to provide your oncology and hematology care.  To afford each patient quality time with our providers, please arrive at least 15 minutes before your scheduled appointment time.  With your help, our goal is to use those 15 minutes to complete the necessary work-up to ensure our physicians have the information they need to help with your evaluation and healthcare recommendations.    Effective January 1st, 2014, we ask that you re-schedule your appointment with our physicians should you arrive 10 or more minutes late for your appointment.  We strive to give you quality time with our providers, and arriving late affects you and other patients whose appointments are after yours.    Again, thank you for choosing [facility].  Our hope is that these requests will decrease the amount of time that you wait before being seen by our physicians.       _____________________________________________________________  Should you have questions after your visit to [facility], please contact our office at [phone] between the hours of 8:30 a.m. and 5:00 p.m.  Voicemails left after 4:30 p.m. will not be returned until the following business day.  For prescription refill  requests, have your pharmacy contact our office with your prescription refill request.

## 2013-07-14 ENCOUNTER — Emergency Department (HOSPITAL_COMMUNITY)
Admission: EM | Admit: 2013-07-14 | Discharge: 2013-07-14 | Disposition: A | Discharge status: Home / Self Care | Payer: Self-pay | Attending: Emergency Medicine | Admitting: Emergency Medicine

## 2013-07-14 ENCOUNTER — Encounter (HOSPITAL_COMMUNITY): Payer: Self-pay | Admitting: *Deleted

## 2013-07-14 DIAGNOSIS — N390 Urinary tract infection, site not specified: Secondary | ICD-10-CM | POA: Insufficient documentation

## 2013-07-14 DIAGNOSIS — R109 Unspecified abdominal pain: Secondary | ICD-10-CM

## 2013-07-14 DIAGNOSIS — Z3202 Encounter for pregnancy test, result negative: Secondary | ICD-10-CM | POA: Insufficient documentation

## 2013-07-14 DIAGNOSIS — E7800 Pure hypercholesterolemia, unspecified: Secondary | ICD-10-CM | POA: Insufficient documentation

## 2013-07-14 DIAGNOSIS — Z79899 Other long term (current) drug therapy: Secondary | ICD-10-CM | POA: Insufficient documentation

## 2013-07-14 DIAGNOSIS — R112 Nausea with vomiting, unspecified: Secondary | ICD-10-CM | POA: Insufficient documentation

## 2013-07-14 DIAGNOSIS — R1013 Epigastric pain: Secondary | ICD-10-CM | POA: Insufficient documentation

## 2013-07-14 DIAGNOSIS — R300 Dysuria: Secondary | ICD-10-CM | POA: Insufficient documentation

## 2013-07-14 DIAGNOSIS — D649 Anemia, unspecified: Secondary | ICD-10-CM | POA: Insufficient documentation

## 2013-07-14 DIAGNOSIS — R1012 Left upper quadrant pain: Secondary | ICD-10-CM | POA: Insufficient documentation

## 2013-07-14 DIAGNOSIS — K5900 Constipation, unspecified: Secondary | ICD-10-CM | POA: Insufficient documentation

## 2013-07-14 DIAGNOSIS — Z8619 Personal history of other infectious and parasitic diseases: Secondary | ICD-10-CM | POA: Insufficient documentation

## 2013-07-14 HISTORY — DX: Peptic ulcer, site unspecified, unspecified as acute or chronic, without hemorrhage or perforation: K27.9

## 2013-07-14 HISTORY — DX: Helicobacter pylori (H. pylori) as the cause of diseases classified elsewhere: B96.81

## 2013-07-14 LAB — URINE MICROSCOPIC-ADD ON

## 2013-07-14 LAB — URINALYSIS, ROUTINE W REFLEX MICROSCOPIC
Glucose, UA: NEGATIVE mg/dL
Specific Gravity, Urine: 1.02 (ref 1.005–1.030)
Urobilinogen, UA: 0.2 mg/dL (ref 0.0–1.0)

## 2013-07-14 LAB — CBC
MCH: 24.1 pg — ABNORMAL LOW (ref 26.0–34.0)
MCHC: 32.2 g/dL (ref 30.0–36.0)
Platelets: 343 10*3/uL (ref 150–400)
RBC: 5.02 MIL/uL (ref 3.87–5.11)

## 2013-07-14 LAB — POCT PREGNANCY, URINE: Preg Test, Ur: NEGATIVE

## 2013-07-14 LAB — COMPREHENSIVE METABOLIC PANEL
ALT: 14 U/L (ref 0–35)
AST: 23 U/L (ref 0–37)
Albumin: 4.3 g/dL (ref 3.5–5.2)
Calcium: 9.8 mg/dL (ref 8.4–10.5)
Sodium: 134 mEq/L — ABNORMAL LOW (ref 135–145)
Total Protein: 8.1 g/dL (ref 6.0–8.3)

## 2013-07-14 MED ORDER — SODIUM CHLORIDE 0.9 % IV BOLUS (SEPSIS)
1000.0000 mL | Freq: Once | INTRAVENOUS | Status: AC
  Administered 2013-07-14: 1000 mL via INTRAVENOUS

## 2013-07-14 MED ORDER — HYDROMORPHONE HCL PF 1 MG/ML IJ SOLN
1.0000 mg | Freq: Once | INTRAMUSCULAR | Status: AC
  Administered 2013-07-14: 1 mg via INTRAVENOUS
  Filled 2013-07-14: qty 1

## 2013-07-14 MED ORDER — GI COCKTAIL ~~LOC~~
30.0000 mL | Freq: Once | ORAL | Status: AC
  Administered 2013-07-14: 30 mL via ORAL
  Filled 2013-07-14: qty 30

## 2013-07-14 MED ORDER — RANITIDINE HCL 150 MG PO CAPS: 150.0000 mg | ORAL_CAPSULE | Freq: Every day | ORAL | Status: DC

## 2013-07-14 MED ORDER — ONDANSETRON HCL 4 MG PO TABS: 4.0000 mg | ORAL_TABLET | Freq: Four times a day (QID) | ORAL | Status: DC

## 2013-07-14 MED ORDER — ONDANSETRON HCL 4 MG/2ML IJ SOLN
4.0000 mg | Freq: Once | INTRAMUSCULAR | Status: AC
  Administered 2013-07-14: 4 mg via INTRAVENOUS
  Filled 2013-07-14: qty 2

## 2013-07-14 MED ORDER — CEPHALEXIN 500 MG PO CAPS: ORAL_CAPSULE | ORAL | Status: DC

## 2013-07-14 MED ORDER — HYDROCODONE-ACETAMINOPHEN 5-325 MG PO TABS: 1.0000 | ORAL_TABLET | ORAL | Status: DC | PRN

## 2013-07-14 NOTE — ED Notes (Signed)
Has had progressive abdominal pain and distension x 3 weeks.  Since Sunday has been more nauseated, vomited 3-4 x Monday.  Unable to keep food down and has no appetite.  Also reports dysuria.

## 2013-07-14 NOTE — ED Provider Notes (Signed)
CSN: [id number]     Arrival date & time 07/14/13  1437 History   First MD Initiated Contact with Patient 07/14/13 1501     Chief Complaint  Patient presents with  . Abdominal Pain  . Dysuria   (Consider location/radiation/quality/duration/timing/severity/associated sxs/prior Treatment) HPI Comments: 47 year-old female presents with 3 weeks of progressive epigastric and left upper quadrant abdominal pain. She states it feels like her stomach is bloating and she's having a pain in her epigastrium but feels like someone's punching her. Her last 3-4 days she's also been having nausea and vomiting, worse with food. The pain is currently an 8/10. She's tried Advil twice without relief. She tried Maalox once but it did not help much. Always makes her pain worse. She denies any hematemesis, hematochezia, or melena. She's been constipated and tried a laxative until her most recent stool was loose. Denies any fevers or chills. She is about to start her period. She also has been having dysuria and urgency over the past couple days as well.  The history is provided by the patient.    Past Medical History  Diagnosis Date  . Anemia   . Hypercholesteremia   . H pylori ulcer    History reviewed. No pertinent past surgical history. Family History  Problem Relation Age of Onset  . Stroke Mother   . Diabetes Father    History  Substance Use Topics  . Smoking status: Never Smoker   . Smokeless tobacco: Never Used  . Alcohol Use: No   OB History   Grav Para Term Preterm Abortions TAB SAB Ect Mult Living                 Review of Systems  Constitutional: Negative for fever and chills.  Respiratory: Negative for shortness of breath.   Cardiovascular: Negative for chest pain.  Gastrointestinal: Positive for nausea, vomiting, abdominal pain and constipation. Negative for blood in stool.  Genitourinary: Positive for dysuria, urgency and frequency. Negative for vaginal discharge and vaginal pain.   Musculoskeletal: Negative for back pain.  All other systems reviewed and are negative.    Allergies  Review of patient's allergies indicates no known allergies.  Home Medications   Current Outpatient Rx  Name  Route  Sig  Dispense  Refill  . acetaminophen (TYLENOL) 500 MG tablet   Oral   Take 500 mg by mouth as needed for pain.         . ferrous sulfate 324 (65 FE) MG TBEC   Oral   Take 1 tablet by mouth daily.         Marland Kitchen ibuprofen (ADVIL,MOTRIN) 200 MG tablet   Oral   Take 200 mg by mouth as needed for pain.         . pseudoephedrine-acetaminophen (TYLENOL SINUS) 30-500 MG TABS   Oral   Take 1 tablet by mouth as needed.         . simvastatin (ZOCOR) 20 MG tablet   Oral   Take 20 mg by mouth every evening.          BP 123/79  Pulse 84  Temp(Src) 98.5 F (36.9 C) (Oral)  Resp 17  Ht 5\' 2"  (1.575 m)  Wt 136 lb (61.689 kg)  BMI 24.87 kg/m2  SpO2 99%  LMP 06/25/2013 Physical Exam  Nursing note and vitals reviewed. Constitutional: She is oriented to person, place, and time. She appears well-developed and well-nourished. No distress.  HENT:  Head: Normocephalic and atraumatic.  Right  Ear: External ear normal.  Left Ear: External ear normal.  Nose: Nose normal.  Eyes: Right eye exhibits no discharge. Left eye exhibits no discharge.  Cardiovascular: Normal rate, regular rhythm and normal heart sounds.   Pulmonary/Chest: Effort normal and breath sounds normal.  Abdominal: Soft. There is tenderness in the epigastric area and left upper quadrant.  Neurological: She is alert and oriented to person, place, and time.  Skin: Skin is warm and dry.    ED Course  Procedures (including critical care time) Labs Review Labs Reviewed  CBC - Abnormal; Notable for the following:    MCV 74.9 (*)    MCH 24.1 (*)    RDW 22.2 (*)    All other components within normal limits  COMPREHENSIVE METABOLIC PANEL - Abnormal; Notable for the following:    Sodium 134 (*)     Creatinine, Ser 1.51 (*)    Total Bilirubin 0.2 (*)    GFR calc non Af Amer 41 (*)    GFR calc Af Amer 47 (*)    All other components within normal limits  URINALYSIS, ROUTINE W REFLEX MICROSCOPIC - Abnormal; Notable for the following:    Hgb urine dipstick SMALL (*)    Ketones, ur TRACE (*)    Leukocytes, UA SMALL (*)    All other components within normal limits  URINE MICROSCOPIC-ADD ON - Abnormal; Notable for the following:    Squamous Epithelial / LPF MANY (*)    Bacteria, UA FEW (*)    All other components within normal limits  URINE CULTURE  LIPASE, BLOOD  POCT PREGNANCY, URINE   Imaging Review No results found.  MDM   1. Abdominal pain   2. UTI (urinary tract infection)    Patient abdominal exam is benign. There is no lower abdominal tenderness. I doubt there is an acute surgical abdomen. Patient's presentation is most consistent with gastritis or ulcer. I recommended she stay with NSAIDs. Patient states that the GI cocktail made her pain worse instantly. She was given fluids for her increasing creatinine. Her hemoglobin is stable. I do not feel that she needs inpatient admission for rehydration. Do not feel she needs imaging at this time. I will discharge her home with instructions for fluids, PPI, and anti-emetics. Her urine is a dirty catch but with 11-12 white blood cells and her symptoms I will treat and send urine for culture. Discuss strict return precautions with the patient.    Audree Camel, MD 07/14/13 1725

## 2013-07-15 LAB — URINE CULTURE: Colony Count: 55000

## 2013-07-16 NOTE — ED Notes (Signed)
+   Urine Patient treated with Cephalexin per protocol MD. 

## 2013-08-04 ENCOUNTER — Encounter: Payer: Self-pay | Admitting: Hematology and Oncology

## 2013-09-13 ENCOUNTER — Other Ambulatory Visit: Payer: Self-pay | Admitting: Obstetrics & Gynecology

## 2013-09-13 ENCOUNTER — Ambulatory Visit (INDEPENDENT_AMBULATORY_CARE_PROVIDER_SITE_OTHER): Payer: Self-pay | Admitting: Obstetrics & Gynecology

## 2013-09-13 ENCOUNTER — Encounter: Payer: Self-pay | Admitting: Obstetrics & Gynecology

## 2013-09-13 VITALS — BP 100/60 | Temp 98.1°F | Ht 59.0 in | Wt 134.0 lb

## 2013-09-13 DIAGNOSIS — N898 Other specified noninflammatory disorders of vagina: Secondary | ICD-10-CM

## 2013-09-13 NOTE — Progress Notes (Signed)
Patient ID: Cheryl Graham, female   DOB: 02/12/1966, 47 y.o.   MRN: [id number] Pt referred for vaginal lesion  Anterior to cervix pink lesion 1 x 1 cm Biopsy taken monsel's placed  follow up next week

## 2013-09-21 ENCOUNTER — Encounter: Payer: Self-pay | Admitting: Obstetrics & Gynecology

## 2013-09-21 ENCOUNTER — Ambulatory Visit (INDEPENDENT_AMBULATORY_CARE_PROVIDER_SITE_OTHER): Payer: Self-pay | Admitting: Obstetrics & Gynecology

## 2013-09-21 ENCOUNTER — Encounter (INDEPENDENT_AMBULATORY_CARE_PROVIDER_SITE_OTHER): Payer: Self-pay

## 2013-09-21 VITALS — BP 100/70 | Wt 134.0 lb

## 2013-09-21 DIAGNOSIS — N8042 Endometriosis of rectovaginal septum with involvement of vagina: Secondary | ICD-10-CM | POA: Insufficient documentation

## 2013-09-21 DIAGNOSIS — N804 Endometriosis of rectovaginal septum and vagina: Secondary | ICD-10-CM

## 2013-09-21 DIAGNOSIS — N920 Excessive and frequent menstruation with regular cycle: Secondary | ICD-10-CM

## 2013-09-21 MED ORDER — MEGESTROL ACETATE 40 MG PO TABS: 40.0000 mg | ORAL_TABLET | Freq: Every day | ORAL | Status: DC

## 2013-09-21 NOTE — Progress Notes (Signed)
Patient ID: Cheryl Graham, female   DOB: [birth year], 47 y.o.   MRN: [id number] biospy endometriosis  Pt also with irregular periods sometime heavy  Will try megestrol to manage both  Follow up prn

## 2013-10-08 ENCOUNTER — Other Ambulatory Visit (HOSPITAL_COMMUNITY): Payer: Self-pay

## 2013-10-09 ENCOUNTER — Ambulatory Visit (HOSPITAL_COMMUNITY): Payer: Self-pay | Admitting: Oncology

## 2013-10-09 ENCOUNTER — Ambulatory Visit (HOSPITAL_COMMUNITY): Payer: Self-pay

## 2013-12-20 ENCOUNTER — Telehealth: Payer: Self-pay

## 2013-12-20 NOTE — Telephone Encounter (Signed)
Pt requesting name of lab were biopsy was sent on 09/13/13, has question about bill. Pt informed of [employer] # [phone].

## 2015-10-15 ENCOUNTER — Encounter: Payer: Self-pay | Admitting: Physician Assistant

## 2015-10-15 ENCOUNTER — Ambulatory Visit: Payer: Self-pay | Admitting: Physician Assistant

## 2015-10-15 VITALS — BP 110/70 | HR 92 | Temp 97.7°F | Ht 59.0 in | Wt 140.4 lb

## 2015-10-15 DIAGNOSIS — R300 Dysuria: Secondary | ICD-10-CM

## 2015-10-15 DIAGNOSIS — B009 Herpesviral infection, unspecified: Secondary | ICD-10-CM | POA: Insufficient documentation

## 2015-10-15 DIAGNOSIS — D649 Anemia, unspecified: Secondary | ICD-10-CM

## 2015-10-15 DIAGNOSIS — R350 Frequency of micturition: Secondary | ICD-10-CM

## 2015-10-15 DIAGNOSIS — J309 Allergic rhinitis, unspecified: Secondary | ICD-10-CM

## 2015-10-15 DIAGNOSIS — E785 Hyperlipidemia, unspecified: Secondary | ICD-10-CM | POA: Insufficient documentation

## 2015-10-15 LAB — POCT URINALYSIS DIPSTICK
BILIRUBIN UA: NEGATIVE
Blood, UA: NEGATIVE
GLUCOSE UA: NEGATIVE
KETONES UA: NEGATIVE
Leukocytes, UA: NEGATIVE
Nitrite, UA: NEGATIVE
PROTEIN UA: NEGATIVE
Urobilinogen, UA: 0.2
pH, UA: 5

## 2015-10-15 MED ORDER — ACYCLOVIR 200 MG PO CAPS: 400.0000 mg | ORAL_CAPSULE | Freq: Two times a day (BID) | ORAL | Status: DC

## 2015-10-15 NOTE — Progress Notes (Signed)
BP 110/70 mmHg  Pulse 92  Temp(Src) 97.7 F (36.5 C)  Ht 4\' 11"  (1.499 m)  Wt 140 lb 6.4 oz (63.685 kg)  BMI 28.34 kg/m2  SpO2 96%   Subjective:    Patient ID: Cheryl Graham, female    DOB: 15-Jun-1966, 49 y.o.   MRN: [id number]  HPI: Cheryl Graham is a 49 y.o. female presenting on 10/15/2015 for Follow-up; Dysuria; and Sinus Problem   HPI  Chief Complaint  Patient presents with  . Follow-up  . Dysuria    burning, itching ,frequency, yellow discharge  . Sinus Problem    neck and head hurt   Dysuria x 1 mo C/o congestion- nothing new or worse, states her usual chronic congestion. Pt requests refill on acyclovir (she got at health dept for hsv)  Relevant past medical, surgical, family and social history reviewed and updated as indicated. Interim medical history since our last visit reviewed. Allergies and medications reviewed and updated.  Current outpatient prescriptions:  .  acyclovir (ZOVIRAX) 200 MG capsule, Take 200 mg by mouth 2 (two) times daily as needed., Disp: , Rfl:  .  ibuprofen (ADVIL,MOTRIN) 200 MG tablet, Take 200 mg by mouth as needed for pain., Disp: , Rfl:  .  Pseudoephedrine-Ibuprofen (IBUPROFEN AND PSE COLD & SINUS) 30-200 MG TABS, Take 1 tablet by mouth. 4-6 hours prn, Disp: , Rfl:   Review of Systems  Constitutional: Negative for fever, chills, diaphoresis, appetite change, fatigue and unexpected weight change.  HENT: Positive for congestion, dental problem, sneezing and sore throat. Negative for drooling, ear pain, facial swelling, hearing loss, mouth sores, trouble swallowing and voice change.   Eyes: Positive for discharge and itching. Negative for pain, redness and visual disturbance.  Respiratory: Positive for cough. Negative for choking, shortness of breath and wheezing.   Cardiovascular: Negative for chest pain, palpitations and leg swelling.  Gastrointestinal: Negative for vomiting, abdominal pain, diarrhea, constipation and blood in  stool.  Endocrine: Negative for cold intolerance, heat intolerance and polydipsia.  Genitourinary: Positive for dysuria and decreased urine volume. Negative for hematuria.  Musculoskeletal: Negative for back pain, arthralgias and gait problem.  Skin: Negative for rash.  Allergic/Immunologic: Negative for environmental allergies.  Neurological: Negative for seizures, syncope, light-headedness and headaches.  Hematological: Negative for adenopathy.  Psychiatric/Behavioral: Negative for suicidal ideas, dysphoric mood and agitation. The patient is not nervous/anxious.     Per HPI unless specifically indicated above     Objective:    BP 110/70 mmHg  Pulse 92  Temp(Src) 97.7 F (36.5 C)  Ht 4\' 11"  (1.499 m)  Wt 140 lb 6.4 oz (63.685 kg)  BMI 28.34 kg/m2  SpO2 96%  Wt Readings from Last 3 Encounters:  10/15/15 140 lb 6.4 oz (63.685 kg)  09/21/13 134 lb (60.782 kg)  09/13/13 134 lb (60.782 kg)    Physical Exam  Constitutional: She is oriented to person, place, and time. She appears well-developed and well-nourished.  HENT:  Head: Normocephalic and atraumatic.  Right Ear: Hearing, tympanic membrane, external ear and ear canal normal.  Left Ear: Hearing, tympanic membrane, external ear and ear canal normal.  Nose: Nose normal.  Mouth/Throat: Uvula is midline and oropharynx is clear and moist. No oropharyngeal exudate.  Neck: Neck supple.  Cardiovascular: Normal rate and regular rhythm.   Pulmonary/Chest: Effort normal and breath sounds normal. She has no wheezes.  Abdominal: Soft. Bowel sounds are normal. She exhibits no distension and no mass. There is no tenderness. There is  no rebound and no guarding.  Musculoskeletal: She exhibits no edema.  Lymphadenopathy:    She has no cervical adenopathy.  Neurological: She is alert and oriented to person, place, and time.  Skin: Skin is warm and dry.  Psychiatric: She has a normal mood and affect. Her behavior is normal.  Vitals  reviewed.       Assessment & Plan:   Encounter Diagnoses  Name Primary?  Marland Kitchen Anemia, unspecified Yes  . Hyperlipidemia   . HSV (herpes simplex virus) infection   . Dysuria   . Allergic rhinitis, unspecified allergic rhinitis type     Pt to get fasting labs drawn tomorrow.  Will call with results Pt declines mammo F/u 6 mo. rto sooner prn

## 2015-10-16 LAB — LIPID PANEL
Cholesterol: 228 mg/dL — ABNORMAL HIGH (ref 125–200)
HDL: 66 mg/dL (ref >=46)
LDL CALC: 150 mg/dL — AB (ref <130)
TRIGLYCERIDES: 62 mg/dL (ref <150)
Total CHOL/HDL Ratio: 3.5 Ratio (ref <=5.0)
VLDL: 12 mg/dL (ref <30)

## 2015-10-16 LAB — COMPLETE METABOLIC PANEL WITH GFR
ALT: 15 U/L (ref 6–29)
AST: 25 U/L (ref 10–35)
Albumin: 4.2 g/dL (ref 3.6–5.1)
Alkaline Phosphatase: 46 U/L (ref 33–115)
BILIRUBIN TOTAL: 0.4 mg/dL (ref 0.2–1.2)
BUN: 7 mg/dL (ref 7–25)
CO2: 24 mmol/L (ref 20–31)
CREATININE: 0.93 mg/dL (ref 0.50–1.10)
Calcium: 9 mg/dL (ref 8.6–10.2)
Chloride: 102 mmol/L (ref 98–110)
GFR, EST AFRICAN AMERICAN: 85 mL/min (ref >=60)
GFR, Est Non African American: 73 mL/min (ref >=60)
GLUCOSE: 79 mg/dL (ref 65–99)
Potassium: 4.1 mmol/L (ref 3.5–5.3)
Sodium: 136 mmol/L (ref 135–146)
TOTAL PROTEIN: 7 g/dL (ref 6.1–8.1)

## 2015-10-16 LAB — CBC
HEMATOCRIT: 33.2 % — AB (ref 36.0–46.0)
HEMOGLOBIN: 10.8 g/dL — AB (ref 12.0–15.0)
MCH: 25.2 pg — ABNORMAL LOW (ref 26.0–34.0)
MCHC: 32.5 g/dL (ref 30.0–36.0)
MCV: 77.6 fL — ABNORMAL LOW (ref 78.0–100.0)
MPV: 9.3 fL (ref 8.6–12.4)
Platelets: 495 10*3/uL — ABNORMAL HIGH (ref 150–400)
RBC: 4.28 MIL/uL (ref 3.87–5.11)
RDW: 16.1 % — ABNORMAL HIGH (ref 11.5–15.5)
WBC: 6.2 10*3/uL (ref 4.0–10.5)

## 2015-10-21 ENCOUNTER — Encounter: Payer: Self-pay | Admitting: Physician Assistant

## 2015-10-21 ENCOUNTER — Ambulatory Visit: Payer: Self-pay | Admitting: Physician Assistant

## 2015-10-21 VITALS — BP 104/68 | HR 94 | Temp 98.6°F | Ht 59.0 in | Wt 139.0 lb

## 2015-10-21 DIAGNOSIS — L709 Acne, unspecified: Secondary | ICD-10-CM

## 2015-10-21 DIAGNOSIS — Z711 Person with feared health complaint in whom no diagnosis is made: Secondary | ICD-10-CM

## 2015-10-21 MED ORDER — ERYTHROMYCIN 2 % EX GEL: Freq: Two times a day (BID) | CUTANEOUS | Status: DC

## 2015-10-21 NOTE — Patient Instructions (Signed)
Seborrheic Dermatitis Seborrheic dermatitis involves pink or red skin with greasy, flaky scales. It usually occurs on the scalp, and it is often called dandruff. This condition may also affect the eyebrows, nose, ears, chest, and the bearded area of men's faces. It often occurs where skin has more oil (sebaceous) glands. It may come and go for no known reason, and it is often long-lasting (chronic). CAUSES The cause is not known. RISK FACTORS This condition is more like to develop in:  People who are stressed or tired.  People who have skin conditions, such as acne.  People who have certain conditions, such as:  HIV (human immunodeficiency virus).  AIDS (acquired immunodeficiency syndrome).  Parkinson disease.  An eating disorder.  Stroke.  Depression.  Epilepsy.  Alcoholism.  People who live in places that have extreme weather.  People who have a family history of seborrheic dermatitis.  People who use skin creams that are made with alcohol.  People who are 3-43 years old.  People who take certain medicines. SYMPTOMS Symptoms of this condition include:  Thick scales on the scalp.  Redness on the face or in the armpits.  Skin that is flaky. The flakes may be white or yellow.  Skin that seems oily or dry but is not helped with moisturizers.  Itching or burning in the affected areas.). TREATMENT There is no cure for this condition, but treatment can help to manage the symptoms. Treatment may include:  Cortisone (steroid) ointments, creams, and lotions.  Over-the-counter or prescription shampoos. HOME CARE INSTRUCTIONS  Apply over-the-counter and prescription medicines only as told by your health care provider.  Keep all follow-up visits as told by your health care provider. This is important.  Try to reduce your stress, such as with yoga or mediation. If you need help to reduce stress, ask your health care provider.  Shower or bathe as told by your  health care provider.  Use any medicated shampoos as told by your health care provider. SEEK MEDICAL CARE IF:  Your symptoms do not improve with treatment.  Your symptoms get worse.  You have new symptoms.   This information is not intended to replace advice given to you by your health care provider. Make sure you discuss any questions you have with your health care provider.   Document Released: 10/18/2005 Document Revised: 07/09/2015 Document Reviewed: 03/05/2015 Elsevier Interactive Patient Education [employer].

## 2015-10-21 NOTE — Progress Notes (Signed)
BP 104/68 mmHg  Pulse 94  Temp(Src) 98.6 F (37 C)  Ht 4\' 11"  (1.499 m)  Wt 139 lb (63.05 kg)  BMI 28.06 kg/m2  SpO2 98%   Subjective:    Patient ID: Cheryl Graham, female    DOB: [birth year], 49 y.o.   MRN: [id number]  HPI: [name] is a 49 y.o. female presenting on 10/21/2015 for Rash   HPI  Pt c/o problems with scalp, face and throat.  Pt states when she mashes the bumps on her face, it smells like her scalp.  She says she has dandruff- says she has flakes and her hair "gets real heavy".   She says she tries to scratch it all out. Says she tried several different types of shampoos including coal tar shampoo that I had recommeded at previous OV.  She has tried sea breeze on her scalp.  She says she has used creams and pads on her face.  Pt states she gets foul smelling material out of her throat when she "milks" it.    Relevant past medical, surgical, family and social history reviewed and updated as indicated. Interim medical history since our last visit reviewed. Allergies and medications reviewed and updated.  Review of Systems  Constitutional: Negative for fever, chills, diaphoresis, appetite change, fatigue and unexpected weight change.  HENT: Positive for dental problem. Negative for congestion, drooling, ear pain, facial swelling, hearing loss, mouth sores, sneezing, sore throat, trouble swallowing and voice change.   Eyes: Positive for pain. Negative for discharge, redness, itching and visual disturbance.  Respiratory: Negative for cough, choking, shortness of breath and wheezing.   Cardiovascular: Negative for chest pain, palpitations and leg swelling.  Gastrointestinal: Negative for vomiting, abdominal pain, diarrhea, constipation and blood in stool.  Endocrine: Negative for cold intolerance, heat intolerance and polydipsia.  Genitourinary: Positive for decreased urine volume. Negative for dysuria and hematuria.  Musculoskeletal: Negative for back  pain, arthralgias and gait problem.  Skin: Negative for rash.  Allergic/Immunologic: Negative for environmental allergies.  Neurological: Positive for headaches. Negative for seizures, syncope and light-headedness.  Hematological: Negative for adenopathy.  Psychiatric/Behavioral: Negative for suicidal ideas, dysphoric mood and agitation. The patient is not nervous/anxious.     Per HPI unless specifically indicated above     Objective:    BP 104/68 mmHg  Pulse 94  Temp(Src) 98.6 F (37 C)  Ht 4\' 11"  (1.499 m)  Wt 139 lb (63.05 kg)  BMI 28.06 kg/m2  SpO2 98%  Wt Readings from Last 3 Encounters:  10/21/15 139 lb (63.05 kg)  10/15/15 140 lb 6.4 oz (63.685 kg)  09/21/13 134 lb (60.782 kg)    Physical Exam  Constitutional: She is oriented to person, place, and time. She appears well-developed and well-nourished.  HENT:  Head: Normocephalic and atraumatic.  Mouth/Throat: Uvula is midline, oropharynx is clear and moist and mucous membranes are normal. No oropharyngeal exudate, posterior oropharyngeal edema, posterior oropharyngeal erythema or tonsillar abscesses.  Pulmonary/Chest: Effort normal.  Neurological: She is alert and oriented to person, place, and time.  Skin: Skin is warm, dry and intact.  Close examination of the cheeks of her face reveal closed comedones. Exam of the scalp reveals no flaking at all.  The hair has no bad odors.  Psychiatric: She has a normal mood and affect. Her speech is normal and behavior is normal.  Vitals reviewed.       Assessment & Plan:   Encounter Diagnoses  Name Primary?  . Acne,  unspecified acne type Yes  . Feared complaint without diagnosis      Discussed with pt that she has been seen for these same  Complaints in the past.  Her exam is benign and only reveals some mild facial acne.  Questioned to see if she perhaps needed some counseling as she seems overly focused on smelling bad when in fact she does not have a foul odor.  She  denies needing counseling.  Says she will just have to RTO when she hasn't washed. I told her that everyone smells bad when they don't wash- that is to be expected.    Pt is given rx erythromycin gel for acne and some reading material on dandruff although I made pt aware that she does not currently exhibit dandruff.  Pt has f/u appt already scheduled. She can RTO sooner if needed.

## 2015-11-25 ENCOUNTER — Other Ambulatory Visit: Payer: Self-pay | Admitting: Physician Assistant

## 2015-11-25 MED ORDER — CLINDAMYCIN PHOSPHATE 1 % EX GEL: Freq: Two times a day (BID) | CUTANEOUS | Status: DC

## 2016-04-14 ENCOUNTER — Ambulatory Visit: Payer: Self-pay | Admitting: Physician Assistant

## 2016-05-06 ENCOUNTER — Encounter: Payer: Self-pay | Admitting: Physician Assistant

## 2016-07-27 ENCOUNTER — Ambulatory Visit: Payer: Self-pay | Admitting: Physician Assistant

## 2018-12-28 ENCOUNTER — Other Ambulatory Visit: Payer: Self-pay

## 2018-12-28 ENCOUNTER — Encounter (HOSPITAL_COMMUNITY): Payer: Self-pay | Admitting: Emergency Medicine

## 2018-12-28 ENCOUNTER — Emergency Department (HOSPITAL_COMMUNITY): Payer: Self-pay

## 2018-12-28 ENCOUNTER — Emergency Department (HOSPITAL_COMMUNITY)
Admission: EM | Admit: 2018-12-28 | Discharge: 2018-12-29 | Disposition: A | Discharge status: Home / Self Care | Payer: Self-pay | Attending: Emergency Medicine | Admitting: Emergency Medicine

## 2018-12-28 DIAGNOSIS — D508 Other iron deficiency anemias: Secondary | ICD-10-CM | POA: Insufficient documentation

## 2018-12-28 DIAGNOSIS — R509 Fever, unspecified: Secondary | ICD-10-CM | POA: Insufficient documentation

## 2018-12-28 DIAGNOSIS — J101 Influenza due to other identified influenza virus with other respiratory manifestations: Secondary | ICD-10-CM | POA: Insufficient documentation

## 2018-12-28 DIAGNOSIS — R05 Cough: Secondary | ICD-10-CM | POA: Insufficient documentation

## 2018-12-28 DIAGNOSIS — Z79899 Other long term (current) drug therapy: Secondary | ICD-10-CM | POA: Insufficient documentation

## 2018-12-28 LAB — CBC WITH DIFFERENTIAL/PLATELET
ABS IMMATURE GRANULOCYTES: 0.03 10*3/uL (ref 0.00–0.07)
Basophils Absolute: 0.1 10*3/uL (ref 0.0–0.1)
Basophils Relative: 1 %
EOS PCT: 0 %
Eosinophils Absolute: 0 10*3/uL (ref 0.0–0.5)
HEMATOCRIT: 33.5 % — AB (ref 36.0–46.0)
Hemoglobin: 9.5 g/dL — ABNORMAL LOW (ref 12.0–15.0)
Immature Granulocytes: 0 %
Lymphocytes Relative: 17 %
Lymphs Abs: 1.5 10*3/uL (ref 0.7–4.0)
MCH: 20.1 pg — AB (ref 26.0–34.0)
MCHC: 28.4 g/dL — ABNORMAL LOW (ref 30.0–36.0)
MCV: 70.8 fL — AB (ref 80.0–100.0)
MONO ABS: 1.7 10*3/uL — AB (ref 0.1–1.0)
MONOS PCT: 19 %
NEUTROS ABS: 5.7 10*3/uL (ref 1.7–7.7)
Neutrophils Relative %: 63 %
Platelets: 348 10*3/uL (ref 150–400)
RBC: 4.73 MIL/uL (ref 3.87–5.11)
RDW: 16.9 % — ABNORMAL HIGH (ref 11.5–15.5)
WBC: 9 10*3/uL (ref 4.0–10.5)
nRBC: 0 % (ref 0.0–0.2)

## 2018-12-28 LAB — URINALYSIS, ROUTINE W REFLEX MICROSCOPIC
Bilirubin Urine: NEGATIVE
GLUCOSE, UA: NEGATIVE mg/dL
HGB URINE DIPSTICK: NEGATIVE
Ketones, ur: NEGATIVE mg/dL
Leukocytes,Ua: NEGATIVE
Nitrite: NEGATIVE
PH: 7 (ref 5.0–8.0)
Protein, ur: NEGATIVE mg/dL
Specific Gravity, Urine: 1.005 (ref 1.005–1.030)

## 2018-12-28 LAB — COMPREHENSIVE METABOLIC PANEL
ALBUMIN: 4.3 g/dL (ref 3.5–5.0)
ALT: 29 U/L (ref 0–44)
AST: 37 U/L (ref 15–41)
Alkaline Phosphatase: 54 U/L (ref 38–126)
Anion gap: 11 (ref 5–15)
BILIRUBIN TOTAL: 0.2 mg/dL — AB (ref 0.3–1.2)
BUN: 5 mg/dL — AB (ref 6–20)
CHLORIDE: 102 mmol/L (ref 98–111)
CO2: 22 mmol/L (ref 22–32)
Calcium: 8.9 mg/dL (ref 8.9–10.3)
Creatinine, Ser: 1.06 mg/dL — ABNORMAL HIGH (ref 0.44–1.00)
GFR calc Af Amer: >60 mL/min (ref >60)
GFR calc non Af Amer: >60 mL/min (ref >60)
GLUCOSE: 100 mg/dL — AB (ref 70–99)
POTASSIUM: 3.9 mmol/L (ref 3.5–5.1)
Sodium: 135 mmol/L (ref 135–145)
Total Protein: 8 g/dL (ref 6.5–8.1)

## 2018-12-28 LAB — INFLUENZA PANEL BY PCR (TYPE A & B)
INFLAPCR: POSITIVE — AB
Influenza B By PCR: NEGATIVE

## 2018-12-28 LAB — POC OCCULT BLOOD, ED: FECAL OCCULT BLD: NEGATIVE

## 2018-12-28 MED ORDER — SODIUM CHLORIDE 0.9 % IV SOLN
Freq: Once | INTRAVENOUS | Status: AC
  Administered 2018-12-28: via INTRAVENOUS

## 2018-12-28 MED ORDER — ONDANSETRON HCL 4 MG/2ML IJ SOLN
4.0000 mg | Freq: Once | INTRAMUSCULAR | Status: AC
  Administered 2018-12-28: 4 mg via INTRAVENOUS
  Filled 2018-12-28: qty 2

## 2018-12-28 MED ORDER — OSELTAMIVIR PHOSPHATE 75 MG PO CAPS
75.0000 mg | ORAL_CAPSULE | Freq: Once | ORAL | Status: AC
  Administered 2018-12-28: 75 mg via ORAL
  Filled 2018-12-28: qty 1

## 2018-12-28 MED ORDER — IPRATROPIUM-ALBUTEROL 0.5-2.5 (3) MG/3ML IN SOLN
3.0000 mL | Freq: Once | RESPIRATORY_TRACT | Status: AC
  Administered 2018-12-28: 3 mL via RESPIRATORY_TRACT
  Filled 2018-12-28: qty 3

## 2018-12-28 NOTE — ED Triage Notes (Signed)
Pt c/o tingling in bilateral arms and right leg, cough, headache, fever, and chills.

## 2018-12-28 NOTE — ED Provider Notes (Signed)
[facility] EMERGENCY DEPARTMENT Provider Note   CSN: [id number] Arrival date & time: 12/28/18  2021    History   Chief Complaint Chief Complaint  Patient presents with  . Fever    HPI Cheryl Graham is a 53 y.o. female who presents to the ED with c/o headache, fever and chills. The symptoms started 2 days ago and have gotten worse. Patient reports she has coughed so much she is spitting up blood. Patient states that she thinks she is anemic because she is so tired and feels so bad. Patient reports one episode of anemia that required transfusion.      The history is provided by the patient. No language interpreter was used.  URI  Presenting symptoms: congestion, cough, ear pain, fatigue, fever and sore throat   Severity:  Moderate Onset quality:  Gradual Duration:  2 days Timing:  Constant Progression:  Worsening Chronicity:  New Relieved by:  OTC medications and decongestant Worsened by:  Nothing Ineffective treatments:  Decongestant and OTC medications Associated symptoms: headaches and myalgias     Past Medical History:  Diagnosis Date  . Anemia   . H pylori ulcer   . Hypercholesteremia     Patient Active Problem List   Diagnosis Date Noted  . Anemia, unspecified 10/15/2015  . Hyperlipidemia 10/15/2015  . HSV (herpes simplex virus) infection 10/15/2015  . Rhinitis, allergic 10/15/2015  . Endometriosis of vagina 09/21/2013  . Vaginal lesion 09/13/2013    History reviewed. No pertinent surgical history.   OB History   No obstetric history on file.      Home Medications    Prior to Admission medications   Medication Sig Start Date End Date Taking? Authorizing Provider  acyclovir (ZOVIRAX) 200 MG capsule Take 2 capsules (400 mg total) by mouth 2 (two) times daily. 10/15/15   Soyla Dryer, PA-C  benzonatate (TESSALON) 100 MG capsule Take 1 capsule (100 mg total) by mouth every 8 (eight) hours. 12/29/18   Ashley Murrain, NP  clindamycin (CLINDAGEL)  1 % gel Apply topically 2 (two) times daily. 11/25/15   Soyla Dryer, PA-C  erythromycin with ethanol (EMGEL) 2 % gel Apply topically 2 (two) times daily. 10/21/15   Soyla Dryer, PA-C  ferrous sulfate 325 (65 FE) MG tablet Take 1 tablet (325 mg total) by mouth daily. 12/29/18   Ashley Murrain, NP  ibuprofen (ADVIL,MOTRIN) 200 MG tablet Take 200 mg by mouth as needed for pain.    [provider]  ondansetron (ZOFRAN) 4 MG tablet Take 1 tablet (4 mg total) by mouth every 6 (six) hours. 12/29/18   Ashley Murrain, NP  oseltamivir (TAMIFLU) 75 MG capsule Take 1 capsule (75 mg total) by mouth every 12 (twelve) hours. 12/29/18   Ashley Murrain, NP  Pseudoephedrine-Ibuprofen (IBUPROFEN AND PSE COLD & SINUS) 30-200 MG TABS Take 1 tablet by mouth. 4-6 hours prn    [provider]    Family History Family History  Problem Relation Age of Onset  . Stroke Mother   . Diabetes Father     Social History Social History   Tobacco Use  . Smoking status: Never Smoker  . Smokeless tobacco: Never Used  Substance Use Topics  . Alcohol use: No  . Drug use: No     Allergies   Ampicillin   Review of Systems Review of Systems  Constitutional: Positive for fatigue and fever.  HENT: Positive for congestion, ear pain, postnasal drip, sinus pressure and sore throat.  Eyes: Negative for pain, discharge and itching.  Respiratory: Positive for cough.   Cardiovascular: Negative for chest pain.  Gastrointestinal: Positive for abdominal pain (soreness). Negative for diarrhea, nausea and vomiting.  Genitourinary: Negative for dysuria and frequency.  Musculoskeletal: Positive for back pain and myalgias.  Skin: Negative for rash.  Neurological: Positive for light-headedness and headaches. Negative for syncope.  Hematological: Negative for adenopathy.  Psychiatric/Behavioral: Negative for confusion.     Physical Exam Updated Vital Signs BP 129/82 (BP Location: Right Arm)   Pulse (!)  107   Temp 99.7 F (37.6 C) (Oral)   Resp 16   Ht 5' 2.5" (1.588 m)   Wt 73.5 kg   LMP 11/27/2018   SpO2 99%   BMI 29.16 kg/m   Physical Exam Vitals signs and nursing note reviewed.  Constitutional:      General: She is not in acute distress.    Appearance: She is well-developed.  HENT:     Head: Normocephalic.     Right Ear: Tympanic membrane normal.     Left Ear: Tympanic membrane normal.     Nose: Congestion present.     Mouth/Throat:     Pharynx: Posterior oropharyngeal erythema present.  Eyes:     Extraocular Movements: Extraocular movements intact.     Conjunctiva/sclera: Conjunctivae normal.  Neck:     Musculoskeletal: Neck supple.  Cardiovascular:     Rate and Rhythm: Tachycardia present.  Pulmonary:     Breath sounds: Wheezing present.  Abdominal:     General: Bowel sounds are normal.     Palpations: Abdomen is soft.     Tenderness: There is no abdominal tenderness.  Genitourinary:    Rectum: Guaiac result negative. No mass, external hemorrhoid or internal hemorrhoid. Normal anal tone.  Musculoskeletal: Normal range of motion.  Skin:    General: Skin is warm and dry.  Neurological:     Mental Status: She is alert and oriented to person, place, and time.     Cranial Nerves: No cranial nerve deficit.  Psychiatric:        Mood and Affect: Mood normal.    Patient reports improvement with neb treatment.   ED Treatments / Results  Labs (all labs ordered are listed, but only abnormal results are displayed) Labs Reviewed  INFLUENZA PANEL BY PCR (TYPE A & B) - Abnormal; Notable for the following components:      Result Value   Influenza A By PCR POSITIVE (*)    All other components within normal limits  COMPREHENSIVE METABOLIC PANEL - Abnormal; Notable for the following components:   Glucose, Bld 100 (*)    BUN 5 (*)    Creatinine, Ser 1.06 (*)    Total Bilirubin 0.2 (*)    All other components within normal limits  CBC WITH DIFFERENTIAL/PLATELET -  Abnormal; Notable for the following components:   Hemoglobin 9.5 (*)    HCT 33.5 (*)    MCV 70.8 (*)    MCH 20.1 (*)    MCHC 28.4 (*)    RDW 16.9 (*)    Monocytes Absolute 1.7 (*)    All other components within normal limits  URINALYSIS, ROUTINE W REFLEX MICROSCOPIC - Abnormal; Notable for the following components:   Color, Urine STRAW (*)    All other components within normal limits  POC OCCULT BLOOD, ED    Radiology No results found.  Procedures Procedures (including critical care time)  Medications Ordered in ED Medications  ipratropium-albuterol (DUONEB) 0.5-2.5 (3)  MG/3ML nebulizer solution 3 mL (3 mLs Nebulization Given 12/28/18 2223)  0.9 %  sodium chloride infusion ( Intravenous Stopped 12/29/18 0018)  oseltamivir (TAMIFLU) capsule 75 mg (75 mg Oral Given 12/28/18 2354)  ondansetron (ZOFRAN) injection 4 mg (4 mg Intravenous Given 12/28/18 2354)     Initial Impression / Assessment and Plan / ED Course  I have reviewed the triage vital signs and the nursing notes. SUBJECTIVE:  ARLEIGH DICOLA is a 53 y.o. female who present complaining of flu-like symptoms: fevers, chills, myalgias, congestion, sore throat and cough for 2 days.   OBJECTIVE: Appears moderately ill but not toxic; temperature as noted in vitals. Ears normal. Throat and pharynx normal.  Neck supple. No adenopathy in the neck. Sinuses non tender. The chest with wheezing that improved with neb treatment.  ASSESSMENT: Influenza A Mild asthma symptoms  PLAN: Symptomatic therapy suggested: rest, increase fluids, gargle prn for sore throat and use mist of vaporizer prn. Home neb treatments as needed. Return to the ED if these symptoms worsen or fail to improve as anticipated. Follow up with PCP for recheck of anemia.  Patient also with anemia. Rx for Fe and patient to f/u with PCP Final Clinical Impressions(s) / ED Diagnoses   Final diagnoses:  Influenza A  Other iron deficiency anemia    ED Discharge  Orders         Ordered    ferrous sulfate 325 (65 FE) MG tablet  Daily     12/29/18 0021    oseltamivir (TAMIFLU) 75 MG capsule  Every 12 hours     12/29/18 0021    ondansetron (ZOFRAN) 4 MG tablet  Every 6 hours     12/29/18 0021    benzonatate (TESSALON) 100 MG capsule  Every 8 hours     12/29/18 0025           Janit Bern Spiro, NP 01/04/19 4034    Ezequiel Essex, MD 01/04/19 2018

## 2018-12-29 MED ORDER — ONDANSETRON HCL 4 MG PO TABS: 4.0000 mg | ORAL_TABLET | Freq: Four times a day (QID) | ORAL | 0 refills | Status: DC

## 2018-12-29 MED ORDER — FERROUS SULFATE 325 (65 FE) MG PO TABS: 325.0000 mg | ORAL_TABLET | Freq: Every day | ORAL | 0 refills | Status: DC

## 2018-12-29 MED ORDER — ALBUTEROL SULFATE HFA 108 (90 BASE) MCG/ACT IN AERS
2.0000 | INHALATION_SPRAY | RESPIRATORY_TRACT | Status: DC | PRN
  Filled 2018-12-29: qty 6.7

## 2018-12-29 MED ORDER — BENZONATATE 100 MG PO CAPS: 100.0000 mg | ORAL_CAPSULE | Freq: Three times a day (TID) | ORAL | 0 refills | Status: DC

## 2018-12-29 MED ORDER — OSELTAMIVIR PHOSPHATE 75 MG PO CAPS: 75.0000 mg | ORAL_CAPSULE | Freq: Two times a day (BID) | ORAL | 0 refills | Status: DC

## 2018-12-29 NOTE — Discharge Instructions (Addendum)
Take the medication for nausea as needed. Use the inhaler 2 puffs every 4 hours as needed for cough and wheezing.  Rest and drink plenty of fluids. Follow up with your doctor or return here for worsening symptoms.

## 2020-03-26 ENCOUNTER — Ambulatory Visit: Payer: Self-pay | Admitting: Physician Assistant

## 2020-03-26 ENCOUNTER — Encounter: Payer: Self-pay | Admitting: Physician Assistant

## 2020-03-26 ENCOUNTER — Other Ambulatory Visit: Payer: Self-pay

## 2020-03-26 VITALS — BP 122/70 | HR 109 | Temp 98.1°F | Ht 60.5 in | Wt 163.0 lb

## 2020-03-26 DIAGNOSIS — E785 Hyperlipidemia, unspecified: Secondary | ICD-10-CM

## 2020-03-26 DIAGNOSIS — J329 Chronic sinusitis, unspecified: Secondary | ICD-10-CM

## 2020-03-26 DIAGNOSIS — Z7689 Persons encountering health services in other specified circumstances: Secondary | ICD-10-CM

## 2020-03-26 DIAGNOSIS — R3915 Urgency of urination: Secondary | ICD-10-CM

## 2020-03-26 DIAGNOSIS — D649 Anemia, unspecified: Secondary | ICD-10-CM

## 2020-03-26 DIAGNOSIS — Z5320 Procedure and treatment not carried out because of patient's decision for unspecified reasons: Secondary | ICD-10-CM

## 2020-03-26 LAB — POCT URINALYSIS DIPSTICK
Bilirubin, UA: NEGATIVE
Blood, UA: NEGATIVE
Glucose, UA: NEGATIVE
Ketones, UA: NEGATIVE
Nitrite, UA: NEGATIVE
Protein, UA: NEGATIVE
Spec Grav, UA: 1.01 (ref 1.010–1.025)
Urobilinogen, UA: 0.2 E.U./dL
pH, UA: 6 (ref 5.0–8.0)

## 2020-03-26 NOTE — Progress Notes (Signed)
BP 122/70   Pulse (!) 109   Temp 98.1 F (36.7 C)   Ht 5' 0.5" (1.537 m)   Wt 163 lb (73.9 kg)   SpO2 97%   BMI 31.31 kg/m    Subjective:    Patient ID: Cheryl Graham, female    DOB: 1966/08/11, 54 y.o.   MRN: [id number]  HPI: Cheryl Graham is a 54 y.o. female presenting on 03/26/2020 for New Patient (Initial Visit) (pt last PCP [facility] pt is back here due to financial reasons.), Flank Pain (urinary urgency, frequency. dyuria. pt states sx began about 2 weeks ago and has not taken anything for relief), Facial Pain (pressure on face, and head. pt states she feels she has drainage feeling on the back of her throat and has difficulty breathing from her nose being congested.), and Dental Problem   HPI   Pt had a negative covid 19 screening questionnaire.   Pt was seen here in the past; last seen here in dec 2016.  She says she got both doses of covid vaccination  She is not working.   She says she got laid off last month.  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  Pt complains of her sinuses.  She has chronic sinus (had this complaint at every appt at [facility] in the past).  She had ct sinuses in 2011 so she had issues back over a decade.  Pt also with teeth complaints and urinary issues.   These are also not new complaints for her.  Pt denies anixety or depression.   Pt says she had pap at [facility] not long ago.    Relevant past medical, surgical, family and social history reviewed and updated as indicated. Interim medical history since our last visit reviewed. Allergies and medications reviewed and updated.    Current Outpatient Medications:  .  fluticasone (FLONASE) 50 MCG/ACT nasal spray, Place 1 spray into both nostrils 2 (two) times daily as needed for allergies or rhinitis., Disp: , Rfl:  .  gabapentin (NEURONTIN) 300 MG capsule, Take 600 mg by mouth at bedtime., Disp: , Rfl:  .  meloxicam (MOBIC) 7.5 MG tablet, Take 7.5 mg by mouth daily., Disp: , Rfl:   .  Multiple Vitamin (MULTIVITAMINS PO), Take 1 tablet by mouth daily., Disp: , Rfl:    Review of Systems  Per HPI unless specifically indicated above     Objective:    BP 122/70   Pulse (!) 109   Temp 98.1 F (36.7 C)   Ht 5' 0.5" (1.537 m)   Wt 163 lb (73.9 kg)   SpO2 97%   BMI 31.31 kg/m   Wt Readings from Last 3 Encounters:  03/26/20 163 lb (73.9 kg)  12/28/18 162 lb (73.5 kg)  10/21/15 139 lb (63 kg)    Physical Exam Vitals reviewed.  Constitutional:      General: She is not in acute distress.    Appearance: She is well-developed. She is obese. She is not ill-appearing.  HENT:     Head: Normocephalic and atraumatic.  Eyes:     Conjunctiva/sclera: Conjunctivae normal.     Pupils: Pupils are equal, round, and reactive to light.  Neck:     Thyroid: No thyromegaly.  Cardiovascular:     Rate and Rhythm: Normal rate and regular rhythm.  Pulmonary:     Effort: Pulmonary effort is normal.     Breath sounds: Normal breath sounds.  Abdominal:  General: Bowel sounds are normal.     Palpations: Abdomen is soft. There is no mass.     Tenderness: There is no abdominal tenderness.  Musculoskeletal:     Cervical back: Neck supple.     Right lower leg: No edema.     Left lower leg: No edema.  Lymphadenopathy:     Cervical: No cervical adenopathy.  Skin:    General: Skin is warm and dry.  Neurological:     Mental Status: She is alert and oriented to person, place, and time.     Gait: Gait normal.           Assessment & Plan:   Encounter Diagnoses  Name Primary?  . Encounter to establish care Yes  . Urinary urgency   . Anemia, unspecified type   . Hyperlipidemia, unspecified hyperlipidemia type   . Screening mammography declined   . Chronic sinusitis, unspecified location       Will update labs: Cbc- anemia cmp- elevated cr Lipids- elevated lipids  Recommended referral to ENT - for chronic sinuses as she has never seemed to have gotten suitable  relief.  In the interim, recommended she continue nasal steroid like flonase.  Pt Declines mammogram  pt wanting gabapenin for hand pain.  Discussed obtaining EMG.  F/u 6 wk..  Pt to contact office sooner prn

## 2020-04-28 ENCOUNTER — Other Ambulatory Visit (HOSPITAL_COMMUNITY): Payer: Self-pay | Admitting: Nurse Practitioner

## 2020-04-28 DIAGNOSIS — Z1231 Encounter for screening mammogram for malignant neoplasm of breast: Secondary | ICD-10-CM

## 2020-05-07 ENCOUNTER — Ambulatory Visit: Payer: Self-pay | Admitting: Physician Assistant

## 2020-05-09 ENCOUNTER — Other Ambulatory Visit (HOSPITAL_COMMUNITY): Payer: Self-pay | Admitting: Nurse Practitioner

## 2020-05-09 ENCOUNTER — Ambulatory Visit (HOSPITAL_COMMUNITY)
Admission: RE | Admit: 2020-05-09 | Discharge: 2020-05-09 | Disposition: A | Discharge status: Home / Self Care | Payer: Self-pay | Source: Ambulatory Visit | Attending: Nurse Practitioner | Admitting: Nurse Practitioner

## 2020-05-09 ENCOUNTER — Other Ambulatory Visit: Payer: Self-pay

## 2020-05-09 ENCOUNTER — Encounter (HOSPITAL_COMMUNITY): Payer: Self-pay

## 2020-05-09 DIAGNOSIS — Z1231 Encounter for screening mammogram for malignant neoplasm of breast: Secondary | ICD-10-CM | POA: Insufficient documentation

## 2020-05-30 ENCOUNTER — Other Ambulatory Visit: Payer: Self-pay | Admitting: Nurse Practitioner

## 2020-05-30 ENCOUNTER — Other Ambulatory Visit (HOSPITAL_COMMUNITY): Payer: Self-pay | Admitting: Nurse Practitioner

## 2020-05-30 DIAGNOSIS — N923 Ovulation bleeding: Secondary | ICD-10-CM

## 2020-06-06 ENCOUNTER — Ambulatory Visit (HOSPITAL_COMMUNITY)
Admission: RE | Admit: 2020-06-06 | Discharge: 2020-06-06 | Disposition: A | Discharge status: Home / Self Care | Payer: Self-pay | Source: Ambulatory Visit | Attending: Nurse Practitioner | Admitting: Nurse Practitioner

## 2020-06-06 ENCOUNTER — Other Ambulatory Visit: Payer: Self-pay

## 2020-06-06 DIAGNOSIS — N923 Ovulation bleeding: Secondary | ICD-10-CM | POA: Insufficient documentation

## 2020-06-23 ENCOUNTER — Ambulatory Visit (INDEPENDENT_AMBULATORY_CARE_PROVIDER_SITE_OTHER): Payer: Self-pay | Admitting: Obstetrics & Gynecology

## 2020-06-23 ENCOUNTER — Encounter: Payer: Self-pay | Admitting: Obstetrics & Gynecology

## 2020-06-23 VITALS — BP 126/92 | HR 107 | Ht 62.0 in | Wt 162.2 lb

## 2020-06-23 DIAGNOSIS — N898 Other specified noninflammatory disorders of vagina: Secondary | ICD-10-CM

## 2020-06-23 DIAGNOSIS — N921 Excessive and frequent menstruation with irregular cycle: Secondary | ICD-10-CM

## 2020-06-23 MED ORDER — MEGESTROL ACETATE 40 MG PO TABS: ORAL_TABLET | ORAL | 11 refills | Status: DC

## 2020-06-23 NOTE — Progress Notes (Signed)
Chief Complaint  Patient presents with  . Abnormal Bleeding      54 y.o. G0P0000 Patient's last menstrual period was 04/02/2020 (exact date). The current method of family planning is none.  Outpatient Encounter Medications as of 06/23/2020  Medication Sig  . ferrous sulfate 324 MG TBEC Take 324 mg by mouth.  . fluticasone (FLONASE) 50 MCG/ACT nasal spray Place 1 spray into both nostrils 2 (two) times daily as needed for allergies or rhinitis.  Marland Kitchen gabapentin (NEURONTIN) 300 MG capsule Take 600 mg by mouth at bedtime.  . meloxicam (MOBIC) 7.5 MG tablet Take 7.5 mg by mouth daily.  . Multiple Vitamin (MULTIVITAMINS PO) Take 1 tablet by mouth daily.  . megestrol (MEGACE) 40 MG tablet Take 1 tablet daily for 4 weeks then off 1 week.  Repeat   No facility-administered encounter medications on file as of 06/23/2020.    Subjective Has light spotting episodes between menses 2-3 times per month Had an area at the cervical vaginal junction benign Some discomfort Menses are still heavy heavy Bleeds infrequently now  Past Medical History:  Diagnosis Date  . Anemia   . H pylori ulcer   . Hypercholesteremia     History reviewed. No pertinent surgical history.  OB History    Gravida  0   Para  0   Term  0   Preterm  0   AB  0   Living  0     SAB  0   TAB  0   Ectopic  0   Multiple  0   Live Births  0           No Known Allergies  Social History   Socioeconomic History  . Marital status: Divorced    Spouse name: Not on file  . Number of children: Not on file  . Years of education: Not on file  . Highest education level: Not on file  Occupational History  . Not on file  Tobacco Use  . Smoking status: Never Smoker  . Smokeless tobacco: Never Used  Vaping Use  . Vaping Use: Never used  Substance and Sexual Activity  . Alcohol use: No  . Drug use: No  . Sexual activity: Not Currently    Birth control/protection: None  Other Topics Concern  .  Not on file  Social History Narrative  . Not on file   Social Determinants of Health   Financial Resource Strain: Medium Risk  . Difficulty of Paying Living Expenses: Somewhat hard  Food Insecurity: Food Insecurity Present  . Worried About Charity fundraiser in the Last Year: Sometimes true  . Ran Out of Food in the Last Year: Sometimes true  Transportation Needs: No Transportation Needs  . Lack of Transportation (Medical): No  . Lack of Transportation (Non-Medical): No  Physical Activity: Insufficiently Active  . Days of Exercise per Week: 2 days  . Minutes of Exercise per Session: 30 min  Stress: No Stress Concern Present  . Feeling of Stress : Not at all  Social Connections: Moderately Integrated  . Frequency of Communication with Friends and Family: More than three times a week  . Frequency of Social Gatherings with Friends and Family: More than three times a week  . Attends Religious Services: More than 4 times per year  . Active Member of Clubs or Organizations: Yes  . Attends Archivist Meetings: More than 4 times per year  . Marital Status: Divorced  Family History  Problem Relation Age of Onset  . Stroke Mother   . Breast cancer Mother   . Diabetes Father   . Breast cancer Maternal Aunt     Medications:       Current Outpatient Medications:  .  ferrous sulfate 324 MG TBEC, Take 324 mg by mouth., Disp: , Rfl:  .  fluticasone (FLONASE) 50 MCG/ACT nasal spray, Place 1 spray into both nostrils 2 (two) times daily as needed for allergies or rhinitis., Disp: , Rfl:  .  gabapentin (NEURONTIN) 300 MG capsule, Take 600 mg by mouth at bedtime., Disp: , Rfl:  .  meloxicam (MOBIC) 7.5 MG tablet, Take 7.5 mg by mouth daily., Disp: , Rfl:  .  Multiple Vitamin (MULTIVITAMINS PO), Take 1 tablet by mouth daily., Disp: , Rfl:  .  megestrol (MEGACE) 40 MG tablet, Take 1 tablet daily for 4 weeks then off 1 week.  Repeat, Disp: 28 tablet, Rfl: 11  Objective Blood  pressure (!) 126/92, pulse (!) 107, height 5\' 2"  (1.575 m), weight 162 lb 3.2 oz (73.6 kg), last menstrual period 04/02/2020.  General WDWN female NAD Vulva:  normal appearing vulva with no masses, tenderness or lesions Vagina:  normal mucosa, no discharge, abrasion/erosion of the vaginal cervical junction Cervix:  Normal no lesions Uterus:  normal size, contour, position, consistency, mobility, non-tender Adnexa: ovaries:present,  normal adnexa in size, nontender and no masses   Pertinent ROS No burning with urination, frequency or urgency No nausea, vomiting or diarrhea Nor fever chills or other constitutional symptoms   Labs or studies     Impression Diagnoses this Encounter::   ICD-10-CM   1. Menorrhagia with irregular cycle, perimenopausal  N92.1   2. Vaginal ersoin at junction of vagina and cervix, biopsied 2014, benign  N89.8     Established relevant diagnosis(es):   Plan/Recommendations: Meds ordered this encounter  Medications  . megestrol (MEGACE) 40 MG tablet    Sig: Take 1 tablet daily for 4 weeks then off 1 week.  Repeat    Dispense:  28 tablet    Refill:  11    Labs or Scans Ordered: No orders of the defined types were placed in this encounter.   Management:: >megestrol for menorrhagia >estrogen cream for vaginal erosion casuing episodic spotting, 1 gram every other day  Follow up No follow-ups on file.      All questions were answered.

## 2020-10-15 ENCOUNTER — Encounter: Payer: Self-pay | Admitting: *Deleted

## 2020-10-17 ENCOUNTER — Ambulatory Visit (INDEPENDENT_AMBULATORY_CARE_PROVIDER_SITE_OTHER): Payer: Self-pay | Admitting: Neurology

## 2020-10-17 ENCOUNTER — Other Ambulatory Visit: Payer: Self-pay

## 2020-10-17 ENCOUNTER — Encounter: Payer: Self-pay | Admitting: Neurology

## 2020-10-17 VITALS — BP 132/87 | HR 90 | Ht 62.0 in | Wt 161.0 lb

## 2020-10-17 DIAGNOSIS — R202 Paresthesia of skin: Secondary | ICD-10-CM | POA: Insufficient documentation

## 2020-10-17 DIAGNOSIS — R52 Pain, unspecified: Secondary | ICD-10-CM

## 2020-10-17 DIAGNOSIS — R269 Unspecified abnormalities of gait and mobility: Secondary | ICD-10-CM

## 2020-10-17 MED ORDER — DULOXETINE HCL 60 MG PO CPEP: 60.0000 mg | ORAL_CAPSULE | Freq: Every day | ORAL | 12 refills | Status: DC

## 2020-10-17 NOTE — Progress Notes (Addendum)
Chief Complaint  Patient presents with   New Patient (Initial Visit)    RM EMG 4. Paper referral from [facility], [place] for neuropathy or upper/lower extremities. Sx started about 5 or more years ago. Has gotten worse over time. Having trouble holding pens/gets swollen. Reports pain in limbs/numb/swelling/tingling/stinging/burning/weakness/loss of some feeling in limbs. Problems w/ sitting/standing/walking (5-16min) and also opening things. Has problems w/ sleeping d/t joint pain/swelling/cramps/aching muscles.     HISTORICAL  Cheryl Graham is a 54 year old female, seen in request by her primary care PA [place], [place], for evaluation of paresthesia, initial evaluation was on October 17, 2020.  I reviewed and summarized the referring note.  Past medical history Around 2016, she began to have constellation of symptoms, starting with bilateral hands intermittent swelling, to the point of difficulty making a tight fist, opening jars, also intermittent bilateral fingers numbness, moving up to elbow, shoulder region, sharp burning pain, sometimes woke her up in the middle of the night, she has to sit at the edge of the bed for few hours for the pain to subside it  She also began to have intermittent radiating pain throughout her body, especially bilateral lower extremity, numbness at right lateral thigh area, initially it was only palm-sized, gradually extending to bigger area, she described intermittent gait abnormality, that is usually due to diffuse body achy pain  She has no bowel bladder incontinence,  She was given a trial of gabapentin, meloxicam without helping her symptoms  Laboratory evaluation in June 2021, LDL 130, normal CMP, creatinine of 1.01, TSH 2.32, vitamin D was mildly decreased 27 REVIEW OF SYSTEMS: Full 14 system review of systems performed and notable only for as above All other review of systems were negative.  ALLERGIES: No Known Allergies  HOME  MEDICATIONS: Current Outpatient Medications  Medication Sig Dispense Refill   Cholecalciferol (VITAMIN D3 PO) Take 5,000 Units by mouth daily.     ferrous sulfate 324 MG TBEC Take 324 mg by mouth.     fluticasone (FLONASE) 50 MCG/ACT nasal spray Place 1 spray into both nostrils 2 (two) times daily as needed for allergies or rhinitis.     gabapentin (NEURONTIN) 300 MG capsule Take 600 mg by mouth at bedtime.     megestrol (MEGACE) 40 MG tablet Take 1 tablet daily for 4 weeks then off 1 week.  Repeat 28 tablet 11   meloxicam (MOBIC) 7.5 MG tablet Take 7.5 mg by mouth daily.     Multiple Vitamin (MULTIVITAMINS PO) Take 1 tablet by mouth daily.     No current facility-administered medications for this visit.    PAST MEDICAL HISTORY: Past Medical History:  Diagnosis Date   Allergies    Anemia    H pylori ulcer    Hypercholesteremia    Joint pain    Neuropathy     PAST SURGICAL HISTORY: History reviewed. No pertinent surgical history.  FAMILY HISTORY: Family History  Problem Relation Age of Onset   Stroke Mother    Breast cancer Mother    Diabetes Father    Breast cancer Maternal Aunt     SOCIAL HISTORY: Social History   Socioeconomic History   Marital status: Divorced    Spouse name: Not on file   Number of children: 0   Years of education: BS   Highest education level: Not on file  Occupational History   Occupation: Company secretary  Tobacco Use   Smoking status: Never Smoker   Smokeless tobacco: Never Used  Media planner  Vaping Use: Never used  Substance and Sexual Activity   Alcohol use: No   Drug use: No   Sexual activity: Not Currently    Birth control/protection: None  Other Topics Concern   Not on file  Social History Narrative   Left handed    Soda sometimes   Tea is rare   Social Determinants of Health   Financial Resource Strain: Medium Risk   Difficulty of Paying Living Expenses: Somewhat hard  Food Insecurity: Food  Insecurity Present   Worried About Charity fundraiser in the Last Year: Sometimes true   Ran Out of Food in the Last Year: Sometimes true  Transportation Needs: No Transportation Needs   Lack of Transportation (Medical): No   Lack of Transportation (Non-Medical): No  Physical Activity: Insufficiently Active   Days of Exercise per Week: 2 days   Minutes of Exercise per Session: 30 min  Stress: No Stress Concern Present   Feeling of Stress : Not at all  Social Connections: Moderately Integrated   Frequency of Communication with Friends and Family: More than three times a week   Frequency of Social Gatherings with Friends and Family: More than three times a week   Attends Religious Services: More than 4 times per year   Active Member of [employer] or Organizations: Yes   Attends Music therapist: More than 4 times per year   Marital Status: Divorced  Human resources officer Violence: Not At Risk   Fear of Current or Ex-Partner: No   Emotionally Abused: No   Physically Abused: No   Sexually Abused: No     PHYSICAL EXAM   Vitals:   10/17/20 0958  BP: 132/87  Pulse: 90  SpO2: 98%  Weight: 161 lb (73 kg)  Height: 5\' 2"  (1.575 m)   Not recorded     Body mass index is 29.45 kg/m.  PHYSICAL EXAMNIATION:  Gen: NAD, conversant, well nourised, well groomed                     Cardiovascular: Regular rate rhythm, no peripheral edema, warm, nontender. Eyes: Conjunctivae clear without exudates or hemorrhage Neck: Supple, no carotid bruits. Pulmonary: Clear to auscultation bilaterally   NEUROLOGICAL EXAM:  MENTAL STATUS: Speech:    Speech is normal; fluent and spontaneous with normal comprehension.  Cognition:     Orientation to time, place and person     Normal recent and remote memory     Normal Attention span and concentration     Normal Language, naming, repeating,spontaneous speech     Fund of knowledge   CRANIAL NERVES: CN II: Visual fields are  full to confrontation. Pupils are round equal and briskly reactive to light. CN III, IV, VI: extraocular movement are normal. No ptosis. CN V: Facial sensation is intact to light touch CN VII: Face is symmetric with normal eye closure  CN VIII: Hearing is normal to causal conversation. CN IX, X: Phonation is normal. CN XI: Head turning and shoulder shrug are intact  MOTOR: Limited due to pain, giveaway weakness, felt there was no bilateral proximal and distal upper or lower extremity weakness.  REFLEXES: Reflexes are 2+ and symmetric at the biceps, triceps, knees, and ankles. Plantar responses are flexor.  SENSORY: Intact to light touch, pinprick and vibratory sensation are intact in fingers and toes.  COORDINATION: There is no trunk or limb dysmetria noted.  GAIT/STANCE: Need to push-up to get up from seated position, antalgic,   DIAGNOSTIC  DATA (LABS, IMAGING, TESTING) - I reviewed patient records, labs, notes, testing and imaging myself where available.   ASSESSMENT AND PLAN  MADYN IVINS is a 54 y.o. female    Intermittent bilateral upper lower extremity paresthesia, pain, Diffuse body achy pain,  Need to rule out cervical spondylitic myelopathy, intrinsic muscle disease,  MRI of cervical spine,  Laboratory evaluations, for inflammatory markers,  EMG nerve conduction study  Cymbalta 60 mg daily  Marcial Pacas, M.D. Ph.D.  [facility] [address], [place], [place] [address] Ph: [phone] Fax: [phone]  CC:  [place], O'Laf, PA-C [place],  [place] [address]

## 2020-10-18 LAB — CBC WITH DIFFERENTIAL
Basophils Absolute: 0.1 10*3/uL (ref 0.0–0.2)
Basos: 2 %
EOS (ABSOLUTE): 0.1 10*3/uL (ref 0.0–0.4)
Eos: 1 %
Hematocrit: 43.5 % (ref 34.0–46.6)
Hemoglobin: 13.8 g/dL (ref 11.1–15.9)
Immature Grans (Abs): 0 10*3/uL (ref 0.0–0.1)
Immature Granulocytes: 0 %
Lymphocytes Absolute: 1.3 10*3/uL (ref 0.7–3.1)
Lymphs: 23 %
MCH: 26.6 pg (ref 26.6–33.0)
MCHC: 31.7 g/dL (ref 31.5–35.7)
MCV: 84 fL (ref 79–97)
Monocytes Absolute: 0.7 10*3/uL (ref 0.1–0.9)
Monocytes: 12 %
Neutrophils Absolute: 3.5 10*3/uL (ref 1.4–7.0)
Neutrophils: 62 %
RBC: 5.19 x10E6/uL (ref 3.77–5.28)
RDW: 14.4 % (ref 11.7–15.4)
WBC: 5.6 10*3/uL (ref 3.4–10.8)

## 2020-10-18 LAB — THYROID PANEL WITH TSH
Free Thyroxine Index: 2.4 (ref 1.2–4.9)
T3 Uptake Ratio: 33 % (ref 24–39)
T4, Total: 7.3 ug/dL (ref 4.5–12.0)
TSH: 1.45 u[IU]/mL (ref 0.450–4.500)

## 2020-10-18 LAB — COMPREHENSIVE METABOLIC PANEL
ALT: 22 IU/L (ref 0–32)
AST: 23 IU/L (ref 0–40)
Albumin/Globulin Ratio: 1.9 (ref 1.2–2.2)
Albumin: 5 g/dL — ABNORMAL HIGH (ref 3.8–4.9)
Alkaline Phosphatase: 74 IU/L (ref 44–121)
BUN/Creatinine Ratio: 8 — ABNORMAL LOW (ref 9–23)
BUN: 10 mg/dL (ref 6–24)
Bilirubin Total: 0.4 mg/dL (ref 0.0–1.2)
CO2: 22 mmol/L (ref 20–29)
Calcium: 10.1 mg/dL (ref 8.7–10.2)
Chloride: 103 mmol/L (ref 96–106)
Creatinine, Ser: 1.24 mg/dL — ABNORMAL HIGH (ref 0.57–1.00)
GFR calc Af Amer: 58 mL/min/{1.73_m2} — ABNORMAL LOW (ref >59)
GFR calc non Af Amer: 50 mL/min/{1.73_m2} — ABNORMAL LOW (ref >59)
Globulin, Total: 2.7 g/dL (ref 1.5–4.5)
Glucose: 85 mg/dL (ref 65–99)
Potassium: 4.3 mmol/L (ref 3.5–5.2)
Sodium: 141 mmol/L (ref 134–144)
Total Protein: 7.7 g/dL (ref 6.0–8.5)

## 2020-10-18 LAB — VITAMIN D 25 HYDROXY (VIT D DEFICIENCY, FRACTURES): Vit D, 25-Hydroxy: 34.7 ng/mL (ref 30.0–100.0)

## 2020-10-18 LAB — ANA W/REFLEX IF POSITIVE: Anti Nuclear Antibody (ANA): NEGATIVE

## 2020-10-18 LAB — VITAMIN B12: Vitamin B-12: 416 pg/mL (ref 232–1245)

## 2020-10-18 LAB — CK: Total CK: 439 U/L — ABNORMAL HIGH (ref 32–182)

## 2020-10-18 LAB — FOLATE: Folate: 12.3 ng/mL (ref >3.0)

## 2020-10-18 LAB — FERRITIN: Ferritin: 61 ng/mL (ref 15–150)

## 2020-10-18 LAB — HIV ANTIBODY (ROUTINE TESTING W REFLEX): HIV Screen 4th Generation wRfx: NONREACTIVE

## 2020-10-18 LAB — C-REACTIVE PROTEIN: CRP: 8 mg/L (ref 0–10)

## 2020-10-18 LAB — RPR: RPR Ser Ql: NONREACTIVE

## 2020-10-18 LAB — SEDIMENTATION RATE: Sed Rate: 6 mm/hr (ref 0–40)

## 2020-10-20 ENCOUNTER — Telehealth: Payer: Self-pay | Admitting: Neurology

## 2020-10-20 DIAGNOSIS — R748 Abnormal levels of other serum enzymes: Secondary | ICD-10-CM | POA: Insufficient documentation

## 2020-10-20 NOTE — Telephone Encounter (Signed)
I spoke to the patient and provided her with the lab results. She understands to increase her water intake. She plans to come in next week for repeat labs. I provided her with our office hours for the holidays.

## 2020-10-20 NOTE — Telephone Encounter (Signed)
cone letter exp 04/08/21 order sent to GI they will reach out to the patient o schedule.

## 2020-10-20 NOTE — Telephone Encounter (Signed)
Please call patient, laboratory evaluation showed mild elevated CPK 439, I have ordered repeat test to rule out intrinsic muscle disease,  In addition, there is evidence of elevated creatinine 1.24, indicating mildly decreased GFR of 58, she will benefit increase water intake  Rest of the laboratory evaluation showed no significant abnormalities.  Orders Placed This Encounter  Procedures  . CK  . Aldolase  . Creatinine, Serum   Repeat lab orders were placed, she needs to come in for lab before EMG on Jan 12.

## 2020-10-22 ENCOUNTER — Telehealth: Payer: Self-pay | Admitting: Neurology

## 2020-10-22 MED ORDER — DULOXETINE HCL 60 MG PO CPEP: 60.0000 mg | ORAL_CAPSULE | Freq: Every day | ORAL | 11 refills | Status: DC

## 2020-10-22 NOTE — Telephone Encounter (Signed)
Pt. requests that DULoxetine (CYMBALTA) 60 MG capsule be sent to [facility]. Assist electronically.

## 2020-10-22 NOTE — Telephone Encounter (Signed)
I returned the call to the patient to let her know that we do not have samples. She is expecting her mail order soon and will start the medication when it arrives.

## 2020-10-22 NOTE — Addendum Note (Signed)
Addended by: Noberto Retort C on: 10/22/2020 11:54 AM   Modules accepted: Orders

## 2020-10-22 NOTE — Telephone Encounter (Signed)
Pt is wanting to know if the provider can give her some samples of her DULoxetine (CYMBALTA) 60 MG capsule Please advise.

## 2020-10-22 NOTE — Telephone Encounter (Signed)
I spoke to the patient and she confirmed the following pharmacy:  [facility], [place], [place] does not have the medication available.   Prescription sent to the requested pharmacy.

## 2020-11-06 ENCOUNTER — Other Ambulatory Visit: Payer: Self-pay

## 2020-11-12 ENCOUNTER — Encounter: Payer: Medicaid Other | Admitting: Neurology

## 2020-11-20 ENCOUNTER — Other Ambulatory Visit (INDEPENDENT_AMBULATORY_CARE_PROVIDER_SITE_OTHER): Payer: Self-pay

## 2020-11-20 ENCOUNTER — Ambulatory Visit
Admission: RE | Admit: 2020-11-20 | Discharge: 2020-11-20 | Disposition: A | Discharge status: Home / Self Care | Payer: Self-pay | Source: Ambulatory Visit | Attending: Neurology | Admitting: Neurology

## 2020-11-20 ENCOUNTER — Other Ambulatory Visit: Payer: Self-pay

## 2020-11-20 DIAGNOSIS — R269 Unspecified abnormalities of gait and mobility: Secondary | ICD-10-CM

## 2020-11-20 DIAGNOSIS — R52 Pain, unspecified: Secondary | ICD-10-CM

## 2020-11-20 DIAGNOSIS — Z0289 Encounter for other administrative examinations: Secondary | ICD-10-CM

## 2020-11-20 DIAGNOSIS — R202 Paresthesia of skin: Secondary | ICD-10-CM

## 2020-11-20 DIAGNOSIS — R748 Abnormal levels of other serum enzymes: Secondary | ICD-10-CM

## 2020-11-21 ENCOUNTER — Other Ambulatory Visit: Payer: Self-pay

## 2020-11-23 LAB — CREATININE, SERUM
Creatinine, Ser: 1.17 mg/dL — ABNORMAL HIGH (ref 0.57–1.00)
GFR calc Af Amer: 62 mL/min/{1.73_m2} (ref >59)
GFR calc non Af Amer: 54 mL/min/{1.73_m2} — ABNORMAL LOW (ref >59)

## 2020-11-23 LAB — CK: Total CK: 342 U/L — ABNORMAL HIGH (ref 32–182)

## 2020-11-23 LAB — ALDOLASE: Aldolase: 6.2 U/L (ref 3.3–10.3)

## 2020-11-24 ENCOUNTER — Telehealth: Payer: Self-pay | Admitting: Neurology

## 2020-11-24 ENCOUNTER — Encounter: Payer: Self-pay | Admitting: Neurology

## 2020-11-24 NOTE — Telephone Encounter (Signed)
Please call patient, laboratory evaluation showed mild elevated CPK 342, creatinine 1.17, indicating mild decreased kidney function, estimated GFR of 54,  No evidence of intrinsic muscle disease

## 2020-11-24 NOTE — Telephone Encounter (Signed)
  IMPRESSION: This MRI of the cervical spine without contrast shows the following: 1.    The spinal cord appears normal. 2.    At C5-C6, there are degenerative changes causing moderate foraminal narrowing, right greater than left, that could affect the right C6 nerve root.  There is no definite nerve root compression and no spinal stenosis. 3.    At C6-C7, there are degenerative changes causing borderline spinal stenosis and moderate bilateral foraminal narrowing that could affect the C7 nerve roots.  There is no definite nerve root compression. 4.    Milder degenerative changes at C3-C4, C4-C5 and T1-T2 with no significant foraminal narrowing and no nerve root compression or spinal stenosis.  MRI of the cervical spine showed multilevel degenerative changes, there was no significant canal stenosis, variable degree of foraminal narrowing

## 2020-11-24 NOTE — Telephone Encounter (Signed)
I spoke to the patient and provided her with the cervical MRI findings. She will keep her pending appt for NCV/EMG.

## 2020-11-24 NOTE — Telephone Encounter (Signed)
I spoke to the patient. She verbalized understanding of the findings.

## 2020-12-03 ENCOUNTER — Encounter: Payer: Medicaid Other | Admitting: Neurology

## 2020-12-08 ENCOUNTER — Encounter: Payer: Medicaid Other | Admitting: Neurology

## 2020-12-22 ENCOUNTER — Ambulatory Visit (INDEPENDENT_AMBULATORY_CARE_PROVIDER_SITE_OTHER): Payer: Self-pay | Admitting: Neurology

## 2020-12-22 ENCOUNTER — Ambulatory Visit (INDEPENDENT_AMBULATORY_CARE_PROVIDER_SITE_OTHER): Payer: Medicaid Other | Admitting: Neurology

## 2020-12-22 ENCOUNTER — Other Ambulatory Visit: Payer: Self-pay

## 2020-12-22 DIAGNOSIS — G5603 Carpal tunnel syndrome, bilateral upper limbs: Secondary | ICD-10-CM

## 2020-12-22 DIAGNOSIS — R202 Paresthesia of skin: Secondary | ICD-10-CM

## 2020-12-22 DIAGNOSIS — R269 Unspecified abnormalities of gait and mobility: Secondary | ICD-10-CM

## 2020-12-22 DIAGNOSIS — R52 Pain, unspecified: Secondary | ICD-10-CM

## 2020-12-22 NOTE — Procedures (Signed)
Full Name: Cheryl Graham Gender: Female MRN #: [id number] Date of Birth: [birth year]    Visit Date: 12/22/2020 10:30 Age: 55 Years Examining Physician: Marcial Pacas, MD  Referring Physician: Marcial Pacas, MD History: 55 year old female complains of intermittent joints, body achy pain, bilateral hands paresthesia  Summary of the test:  Nerve conduction study:  Bilateral median sensory response showed moderately prolonged peak latency with normal snap amplitude.  Bilateral median motor responses also showed moderately prolonged distal latency, left worse than right, with normal CMAP amplitude, conduction velocity.  Bilateral sural, superficial peroneal sensory responses were normal. Bilateral tibial, peroneal to EDB motor responses were normal. Left ulnar sensory and motor responses were normal.  Electromyography: Selected needle examination of left upper, lower extremity muscles, left cervical and lumbar paraspinal muscles were normal.  Conclusion: This is an abnormal study.  There is electrodiagnostic evidence of bilateral median neuropathy across the wrist consistent with moderate bilateral carpal tunnel syndromes, left worse than right.  There is no evidence of left cervical or lumbosacral radiculopathy.  There is no evidence of intrinsic muscle disease.    ------------------------------- Marcial Pacas, M.D. PhD  [facility] [address] [place], [place] [address] Tel: [phone] Fax: [phone]  Verbal informed consent was obtained from the patient, patient was informed of potential risk of procedure, including bruising, bleeding, hematoma formation, infection, muscle weakness, muscle pain, numbness, among others.        [place]    Nerve / Sites Muscle Latency Ref. Amplitude Ref. Rel Amp Segments Distance Velocity Ref. Area    ms ms mV mV %  cm m/s m/s mVms  R Median - APB     Wrist APB 5.4 ?4.4 7.1 ?4.0 100 Wrist - APB 7   20.1     Upper  arm APB 9.3  6.7  94.8 Upper arm - Wrist 19 49 ?49 19.6  L Median - APB     Wrist APB 6.5 ?4.4 11.0 ?4.0 100 Wrist - APB 7   32.3     Upper arm APB 10.0  10.7  97.2 Upper arm - Wrist 19 54 ?49 32.6  L Ulnar - ADM     Wrist ADM 2.6 ?3.3 13.5 ?6.0 100 Wrist - ADM 7   41.3     B.Elbow ADM 5.0  10.9  81.1 B.Elbow - Wrist 15 64 ?49 36.7     A.Elbow ADM 6.6  12.0  109 A.Elbow - B.Elbow 10 62 ?49 38.2  R Peroneal - EDB     Ankle EDB 4.6 ?6.5 9.3 ?2.0 100 Ankle - EDB 9   26.5     Fib head EDB 9.9  8.8  94.5 Fib head - Ankle 25 47 ?44 25.9     Pop fossa EDB 12.1  8.4  96.3 Pop fossa - Fib head 10 47 ?44 24.8         Pop fossa - Ankle      L Peroneal - EDB     Ankle EDB 4.7 ?6.5 6.2 ?2.0 100 Ankle - EDB 9   18.4     Fib head EDB 10.2  5.8  92.2 Fib head - Ankle 26 47 ?44 17.5     Pop fossa EDB 12.3  5.5  94.8 Pop fossa - Fib head 10 47 ?44 16.4         Pop fossa - Ankle      R Tibial - AH  Ankle AH 4.2 ?5.8 4.8 ?4.0 100 Ankle - AH 9   9.1     Pop fossa AH 12.1  4.1  86.2 Pop fossa - Ankle 33 42 ?41 13.9  L Tibial - AH     Ankle AH 4.3 ?5.8 4.4 ?4.0 100 Ankle - AH 9   8.8     Pop fossa AH 12.0  4.9  109 Pop fossa - Ankle 32 42 ?41 11.7                   SNC    Nerve / Sites Rec. Site Peak Lat Ref.  Amp Ref. Segments Distance    ms ms V V  cm  R Sural - Ankle (Calf)     Calf Ankle 3.9 ?4.4 13 ?6 Calf - Ankle 14  L Sural - Ankle (Calf)     Calf Ankle 3.2 ?4.4 15 ?6 Calf - Ankle 14  R Superficial peroneal - Ankle     Lat leg Ankle 4.0 ?4.4 12 ?6 Lat leg - Ankle 14  L Superficial peroneal - Ankle     Lat leg Ankle 3.8 ?4.4 12 ?6 Lat leg - Ankle 14  R Median - Orthodromic (Dig II, Mid palm)     Dig II Wrist 4.2 ?3.4 12 ?10 Dig II - Wrist 13  L Median - Orthodromic (Dig II, Mid palm)     Dig II Wrist 5.0 ?3.4 10 ?10 Dig II - Wrist 13  L Ulnar - Orthodromic, (Dig V, Mid palm)     Dig V Wrist 2.9 ?3.1 13 ?5 Dig V - Wrist 52                   F  Wave    Nerve F Lat Ref.   ms ms  R  Tibial - AH 45.6 ?56.0  L Ulnar - ADM 25.3 ?32.0  L Tibial - AH 45.5 ?56.0           EMG Summary Table    Spontaneous MUAP Recruitment  Muscle IA Fib PSW Fasc Other Amp Dur. Poly Pattern  L. Tibialis anterior Normal None None None _______ Normal Normal Normal Normal  L. Tibialis posterior Normal None None None _______ Normal Normal Normal Normal  L. Peroneus longus Normal None None None _______ Normal Normal Normal Normal  L. Gastrocnemius (Medial head) Normal None None None _______ Normal Normal Normal Normal  L. Vastus lateralis Normal None None None _______ Normal Normal Normal Normal  L. Lumbar paraspinals (mid) Normal None None None _______ Normal Normal Normal Normal  L. Lumbar paraspinals (low) Normal None None None _______ Normal Normal Normal Normal  L. Abductor pollicis brevis Normal None None None _______ Normal Normal Normal Normal  L. First dorsal interosseous Normal None None None _______ Normal Normal Normal Normal  L. Pronator teres Normal None None None _______ Normal Normal Normal Normal  L. Biceps brachii Normal None None None _______ Normal Normal Normal Normal  L. Deltoid Normal None None None _______ Normal Normal Normal Normal  L. Cervical paraspinals Normal None None None _______ Normal Normal Normal Normal

## 2020-12-22 NOTE — Progress Notes (Signed)
No chief complaint on file.   HISTORICAL  Cheryl Graham is a 55 year old female, seen in request by her primary care PA [place], [place], for evaluation of paresthesia, initial evaluation was on October 17, 2020.  I reviewed and summarized the referring note.  Past medical history Around 2016, she began to have constellation of symptoms, starting with bilateral hands intermittent swelling, to the point of difficulty making a tight fist, opening jars, also intermittent bilateral fingers numbness, moving up to elbow, shoulder region, sharp burning pain, sometimes woke her up in the middle of the night, she has to sit at the edge of the bed for few hours for the pain to subside it  She also began to have intermittent radiating pain throughout her body, especially bilateral lower extremity, numbness at right lateral thigh area, initially it was only palm-sized, gradually extending to bigger area, she described intermittent gait abnormality, that is usually due to diffuse body achy pain  She has no bowel bladder incontinence,  She was given a trial of gabapentin, meloxicam without helping her symptoms  Laboratory evaluation in June 2021, LDL 130, normal CMP, creatinine of 1.01, TSH 2.32, vitamin D was mildly decreased 27  Update December 22, 2020: Personally reviewed MRI of cervical spine, multilevel degenerative changes, no evidence of cord compression, most noticeable is at C5-6, C6 and 7, with moderate bilateral foraminal narrowing,  Laboratory evaluation showed mild elevated CPK, 342, with evidence of elevated creatinine 1.24, GFR of 58, rest of the laboratory evaluation showed normal ANA, ESR, C-reactive protein, HIV, RPR, CMP, CBC, folic acid, vitamin D level,  She reported mild improvement with Cymbalta 60 mg daily, tolerating medication well, however, when she started Cymbalta she also stopped gabapentin 300 mg 3 times daily and meloxicam 7.5 mg as needed, now she noticed wearing  off the benefit she complains of diffuse body achy pain, right lateral thigh area paresthesia,   EMG nerve conduction study today showed moderate carpal tunnel syndromes, no evidence of intrinsic hand muscle disease  REVIEW OF SYSTEMS: Full 14 system review of systems performed and notable only for as above All other review of systems were negative.  ALLERGIES: No Known Allergies  HOME MEDICATIONS: Current Outpatient Medications  Medication Sig Dispense Refill  . Cholecalciferol (VITAMIN D3 PO) Take 5,000 Units by mouth daily.    . DULoxetine (CYMBALTA) 60 MG capsule Take 1 capsule (60 mg total) by mouth daily. 30 capsule 11  . ferrous sulfate 324 MG TBEC Take 324 mg by mouth.    . fluticasone (FLONASE) 50 MCG/ACT nasal spray Place 1 spray into both nostrils 2 (two) times daily as needed for allergies or rhinitis.    Marland Kitchen gabapentin (NEURONTIN) 300 MG capsule Take 600 mg by mouth at bedtime.    . megestrol (MEGACE) 40 MG tablet Take 1 tablet daily for 4 weeks then off 1 week.  Repeat 28 tablet 11  . meloxicam (MOBIC) 7.5 MG tablet Take 7.5 mg by mouth daily.    . Multiple Vitamin (MULTIVITAMINS PO) Take 1 tablet by mouth daily.     No current facility-administered medications for this visit.    PAST MEDICAL HISTORY: Past Medical History:  Diagnosis Date  . Allergies   . Anemia   . H pylori ulcer   . Hypercholesteremia   . Joint pain   . Neuropathy     PAST SURGICAL HISTORY: No past surgical history on file.  FAMILY HISTORY: Family History  Problem Relation Age of Onset  .  Stroke Mother   . Breast cancer Mother   . Diabetes Father   . Breast cancer Maternal Aunt     SOCIAL HISTORY: Social History   Socioeconomic History  . Marital status: Divorced    Spouse name: Not on file  . Number of children: 0  . Years of education: BS  . Highest education level: Not on file  Occupational History  . Occupation: [employer]  Tobacco Use  . Smoking status: Never Smoker  .  Smokeless tobacco: Never Used  Vaping Use  . Vaping Use: Never used  Substance and Sexual Activity  . Alcohol use: No  . Drug use: No  . Sexual activity: Not Currently    Birth control/protection: None  Other Topics Concern  . Not on file  Social History Narrative   Left handed    Soda sometimes   Tea is rare   Social Determinants of Health   Financial Resource Strain: Medium Risk  . Difficulty of Paying Living Expenses: Somewhat hard  Food Insecurity: Food Insecurity Present  . Worried About Programme researcher, broadcasting/film/video in the Last Year: Sometimes true  . Ran Out of Food in the Last Year: Sometimes true  Transportation Needs: No Transportation Needs  . Lack of Transportation (Medical): No  . Lack of Transportation (Non-Medical): No  Physical Activity: Insufficiently Active  . Days of Exercise per Week: 2 days  . Minutes of Exercise per Session: 30 min  Stress: No Stress Concern Present  . Feeling of Stress : Not at all  Social Connections: Moderately Integrated  . Frequency of Communication with Friends and Family: More than three times a week  . Frequency of Social Gatherings with Friends and Family: More than three times a week  . Attends Religious Services: More than 4 times per year  . Active Member of Clubs or Organizations: Yes  . Attends Banker Meetings: More than 4 times per year  . Marital Status: Divorced  Catering manager Violence: Not At Risk  . Fear of Current or Ex-Partner: No  . Emotionally Abused: No  . Physically Abused: No  . Sexually Abused: No     PHYSICAL EXAM   There were no vitals filed for this visit. Not recorded     There is no height or weight on file to calculate BMI.  PHYSICAL EXAMNIATION:  Gen: NAD, conversant, well nourised, well groomed                     Cardiovascular: Regular rate rhythm, no peripheral edema, warm, nontender. Eyes: Conjunctivae clear without exudates or hemorrhage Neck: Supple, no carotid  bruits. Pulmonary: Clear to auscultation bilaterally   NEUROLOGICAL EXAM:  MENTAL STATUS: Speech/cognition: Awake alert oriented to history taking and care of conversation   CRANIAL NERVES: CN II: Visual fields are full to confrontation. Pupils are round equal and briskly reactive to light. CN III, IV, VI: extraocular movement are normal. No ptosis. CN V: Facial sensation is intact to light touch CN VII: Face is symmetric with normal eye closure  CN VIII: Hearing is normal to causal conversation. CN IX, X: Phonation is normal. CN XI: Head turning and shoulder shrug are intact  MOTOR: Limited due to pain, giveaway weakness, felt there was no bilateral proximal and distal upper or lower extremity weakness.  REFLEXES: Reflexes are 2+ and symmetric at the biceps, triceps, knees, and ankles. Plantar responses are flexor.  SENSORY: Intact to light touch, pinprick and vibratory sensation are intact  in fingers and toes.  COORDINATION: There is no trunk or limb dysmetria noted.  GAIT/STANCE: Need to push-up to get up from seated position, antalgic,   DIAGNOSTIC DATA (LABS, IMAGING, TESTING) - I reviewed patient records, labs, notes, testing and imaging myself where available.   ASSESSMENT AND PLAN  Cheryl Graham is a 55 y.o. female   Bilateral carpal tunnel syndromes  Moderate, demyelinating in nature, no evidence of axonal loss, not a candidate for surgery, Cervical spondylosis,  No evidence of spinal cord compression, no evidence of active radiculopathy Mild elevated CPK,  Likely related to her body mass, mild abnormal kidney function, emphasized importance of keep well hydration, Diffuse body achy pain,  No evidence of inflammatory process,  Emphasized importance of moderate exercise, stretching  Continue Cymbalta 60 mg daily, gabapentin 300 mg 3 times daily, Mobic 7.5 mg as needed,   Only return to clinic for new issues   Marcial Pacas, M.D. Ph.D.  [facility] [address], [place], [place] [address] Ph: [phone] Fax: [phone]  CC:  [place], O'Laf, PA-C [place],  [place] [address]

## 2021-07-14 ENCOUNTER — Other Ambulatory Visit: Payer: Self-pay | Admitting: Obstetrics & Gynecology

## 2021-10-11 IMAGING — MG DIGITAL SCREENING BILAT W/ TOMO W/ CAD
8 series · 8 of 24 positions shown · non-contrast
Comparison: None.

CLINICAL DATA: Screening.

EXAM:
DIGITAL SCREENING BILATERAL MAMMOGRAM WITH TOMO AND CAD

[R MLO synth-2D]
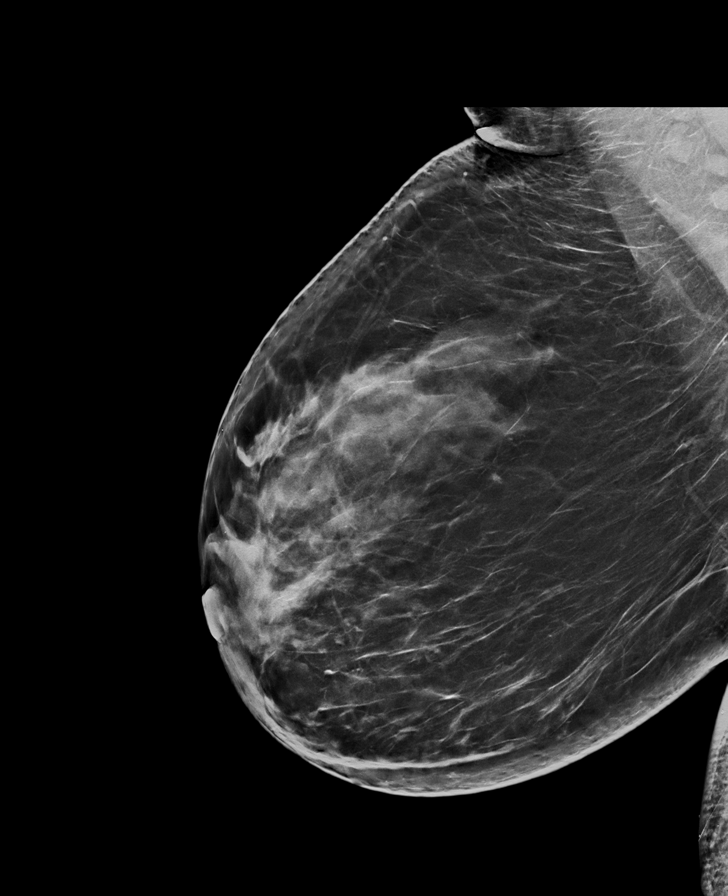

[L MLO synth-2D]
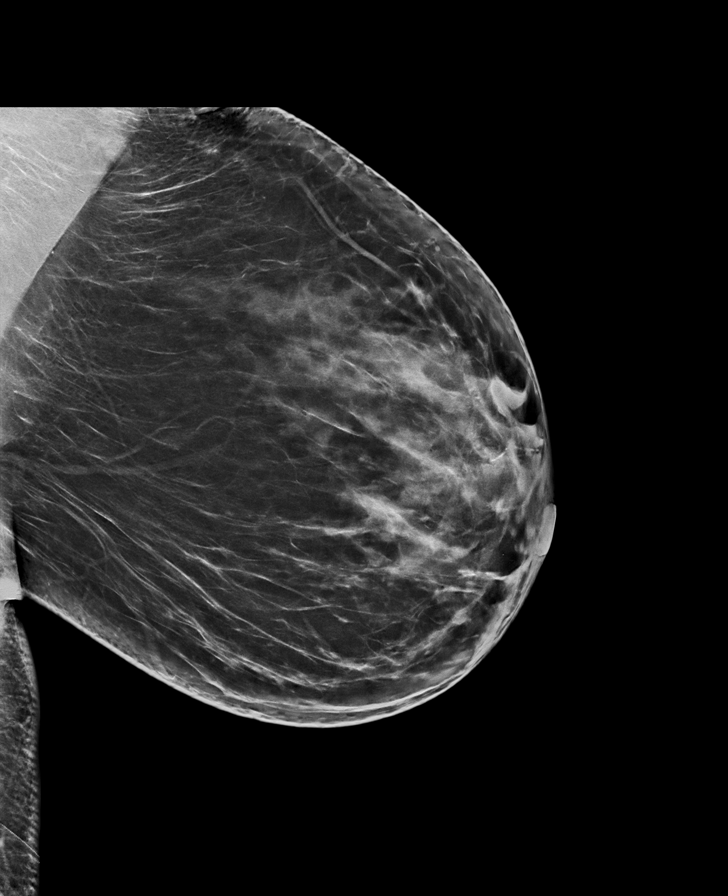

[R CC synth-2D]
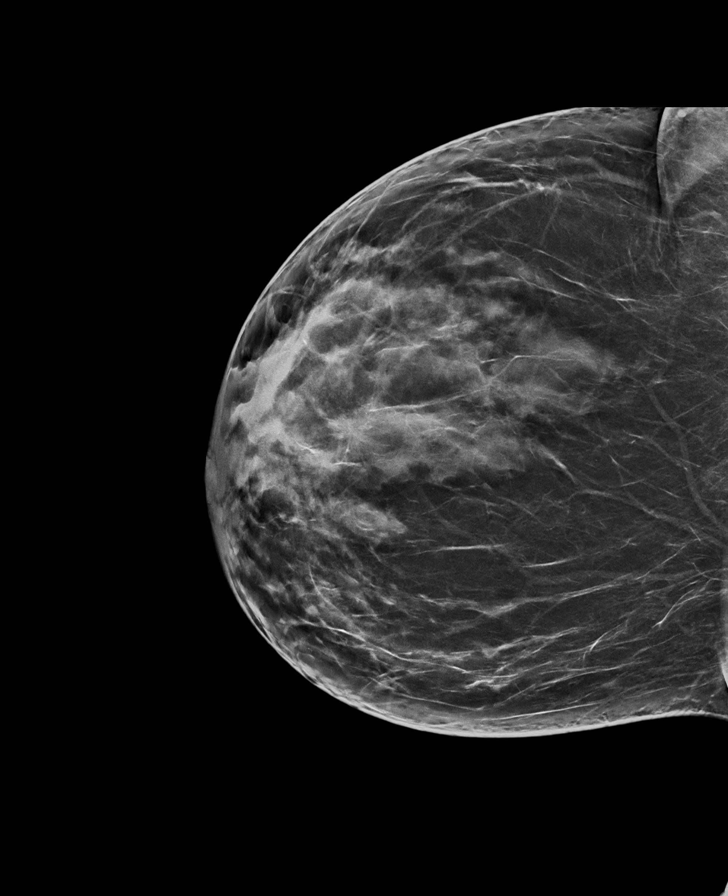

[L CC synth-2D]
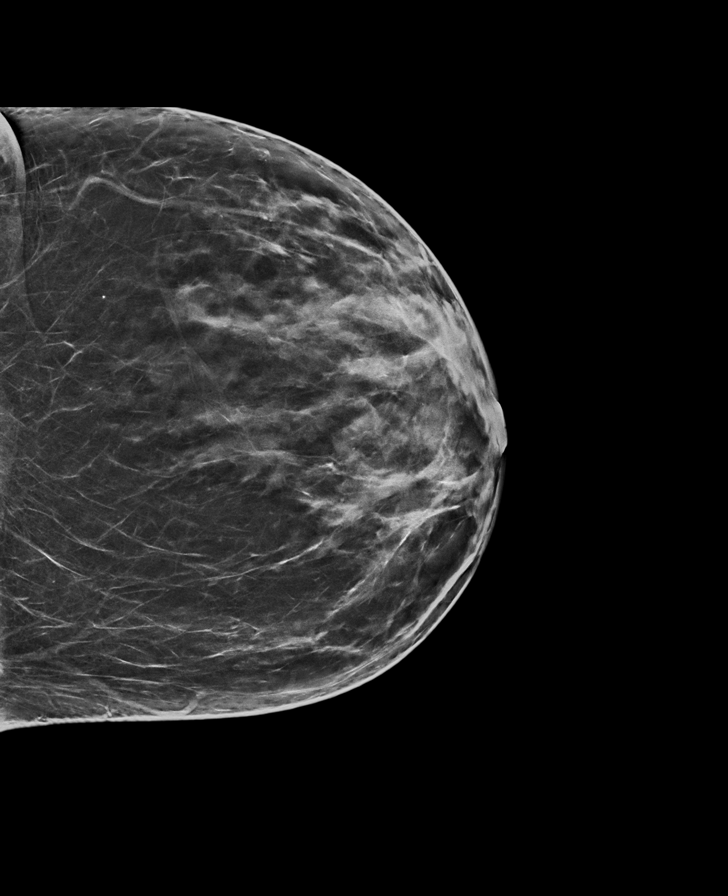

[L MLO tomo · tomo slice 41/82.0]
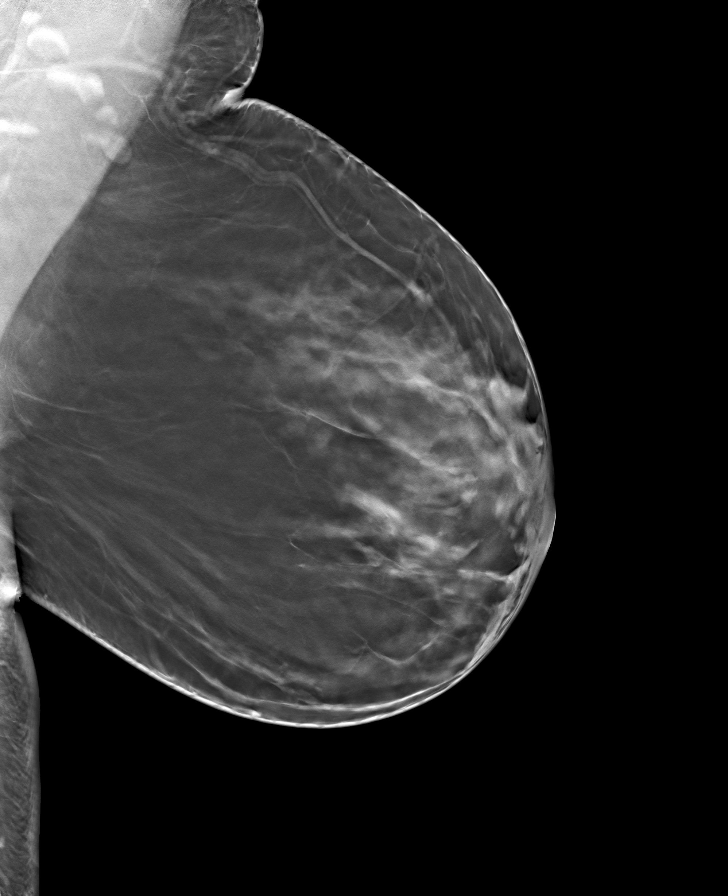

[R CC tomo · tomo slice 39/76.0]
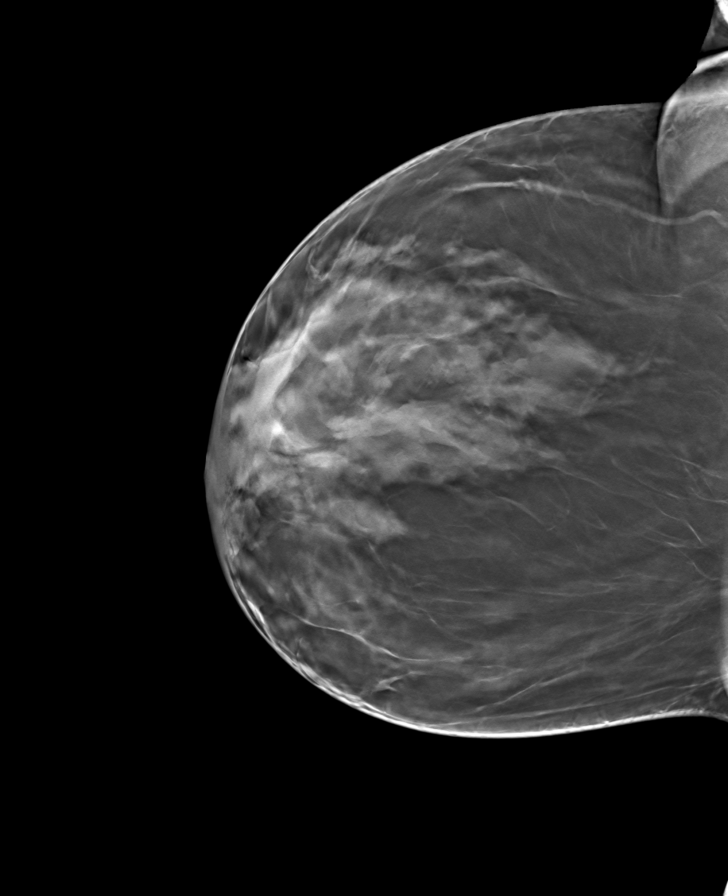

[L CC tomo · tomo slice 37/73.0]
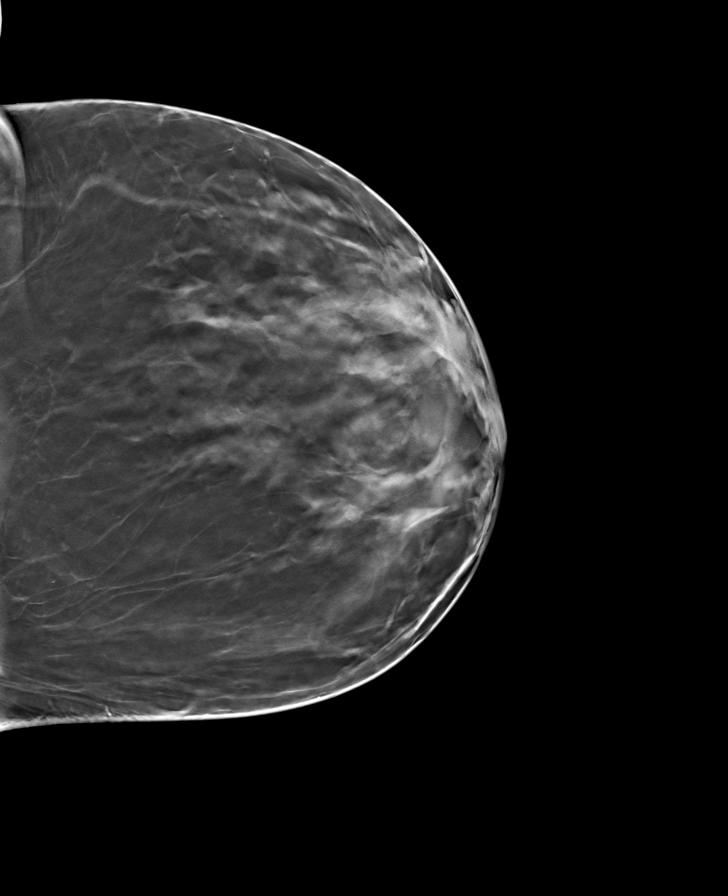

[R MLO tomo · tomo slice 42/83.0]
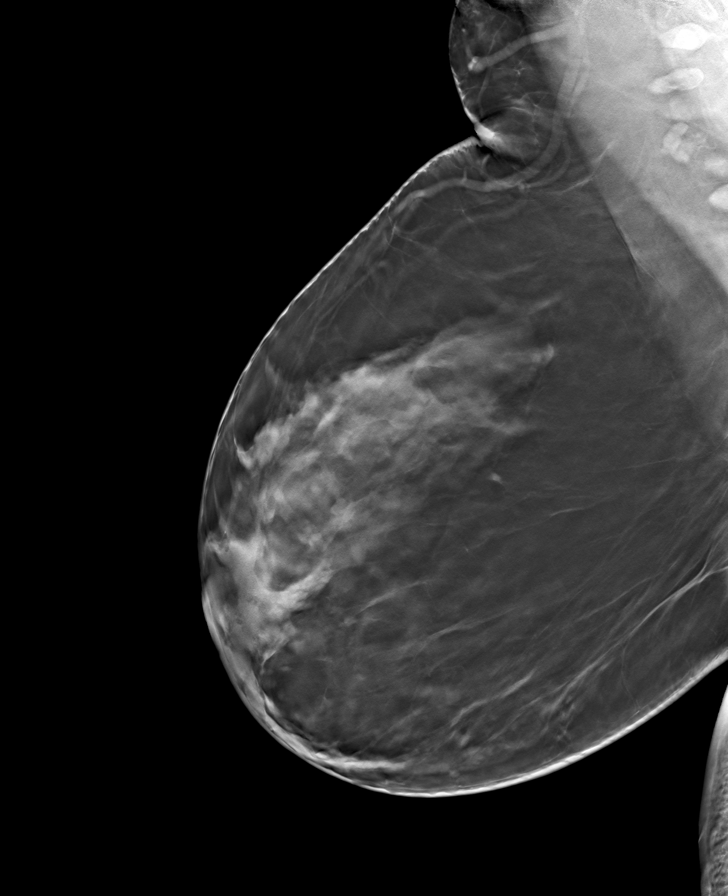

[8 of 24 positions shown; findings below may reference images not displayed]

ACR Breast Density Category c: The breast tissue is heterogeneously
dense, which may obscure small masses
FINDINGS: There are no findings suspicious for malignancy. Images were
processed with CAD.
IMPRESSION: No mammographic evidence of malignancy. A result letter of this
screening mammogram will be mailed directly to the patient.

RECOMMENDATION:
Screening mammogram in one year. (Code:EM-2-IHY)

BI-RADS CATEGORY  1: Negative.

## 2021-11-08 IMAGING — US US PELVIS COMPLETE WITH TRANSVAGINAL
1 series · 14 of 25 positions shown · non-contrast
Comparison: None

CLINICAL DATA: Intermenstrual bleeding

EXAM:
TRANSABDOMINAL AND TRANSVAGINAL ULTRASOUND OF PELVIS
TECHNIQUE: Both transabdominal and transvaginal ultrasound examinations of the
pelvis were performed. Transabdominal technique was performed for
global imaging of the pelvis including uterus, ovaries, adnexal
regions, and pelvic cul-de-sac. It was necessary to proceed with
endovaginal exam following the transabdominal exam to visualize the
uterus endometrium ovaries.

[Series 1: us pelvic complete with transvaginal · 14 of 67 slices shown]
[im 1/67]
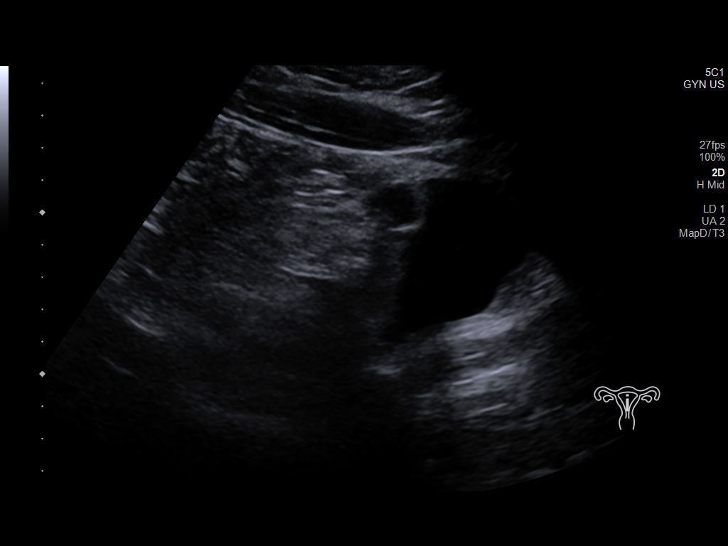
[im 6/67]
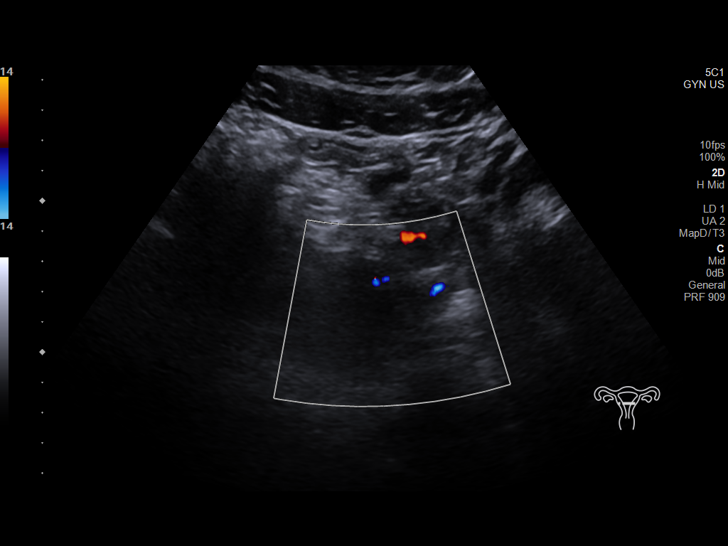
[im 12/67]
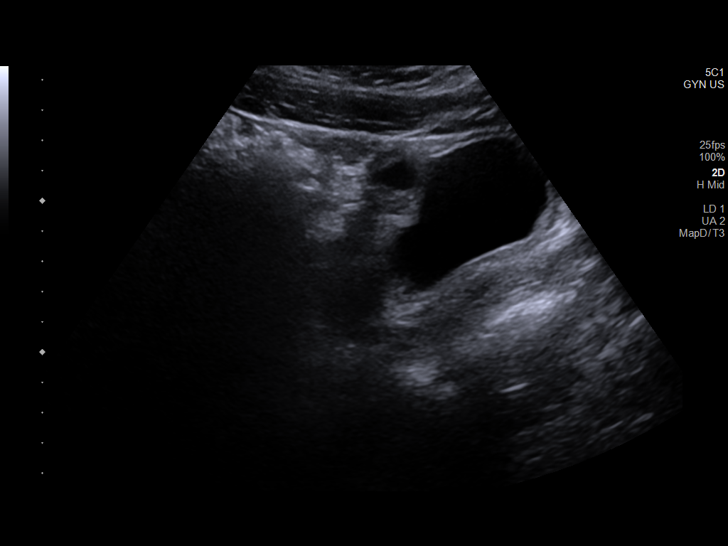
[im 17/67]
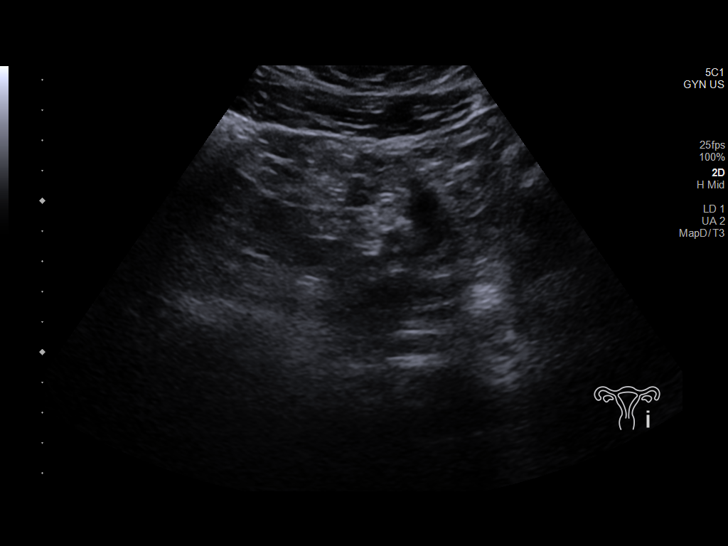
[im 23/67]
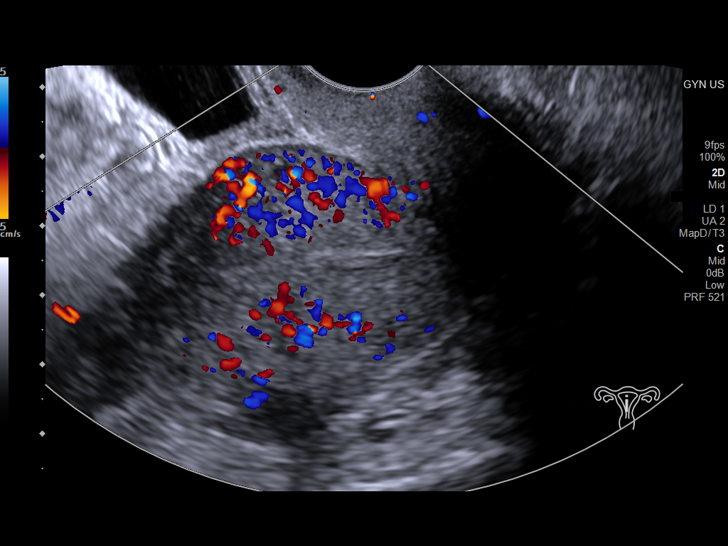
[im 25/67]
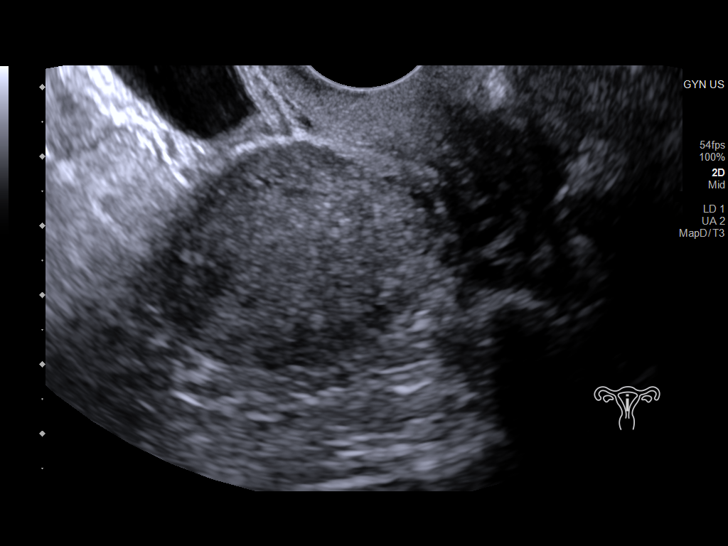
[im 31/67]
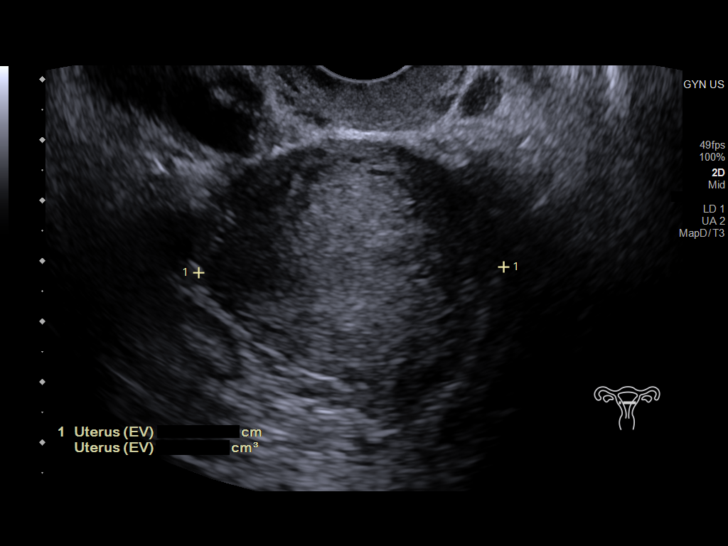
[im 36/67]
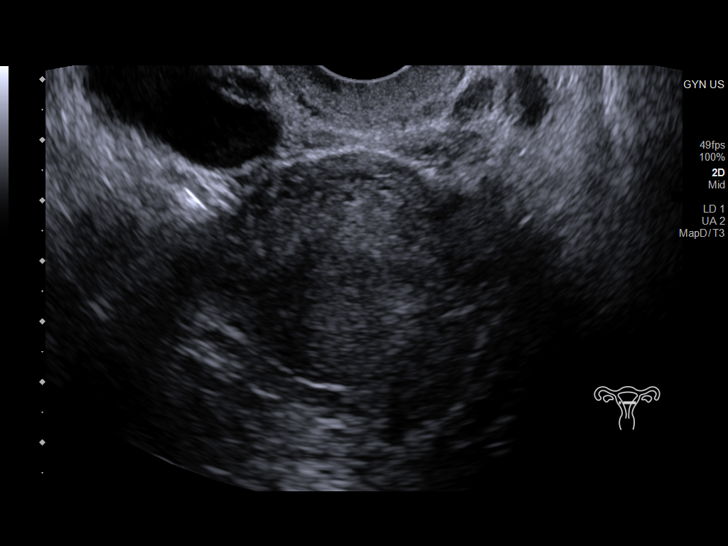
[im 42/67]
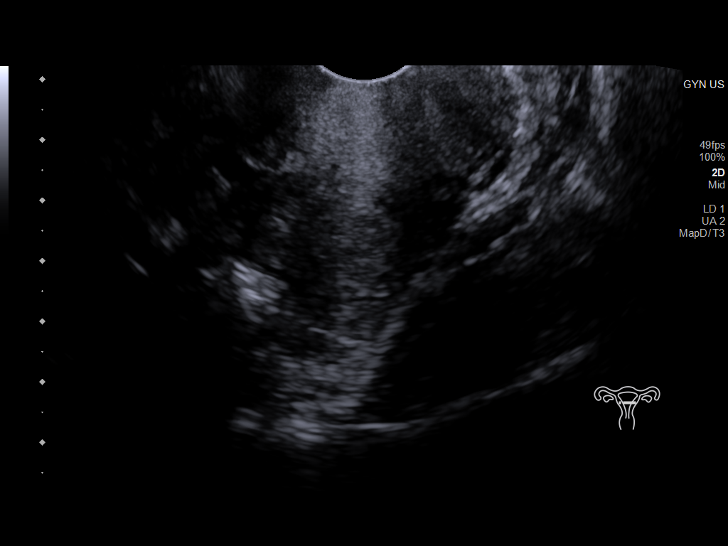
[im 45/67]
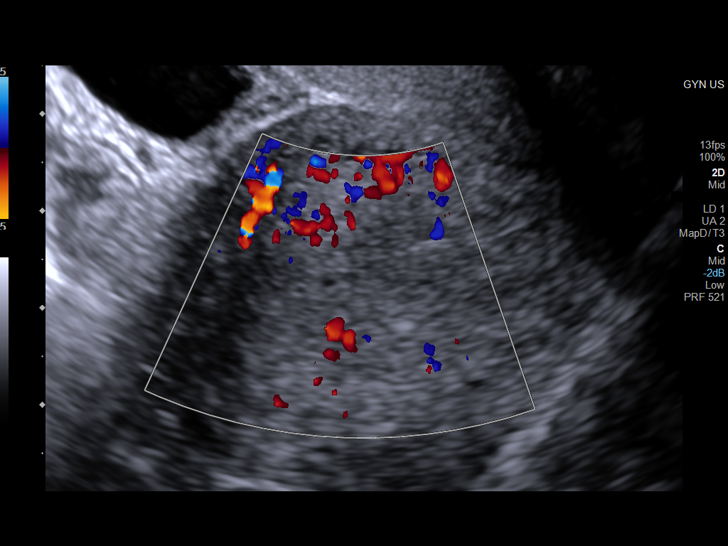
[im 50/67]
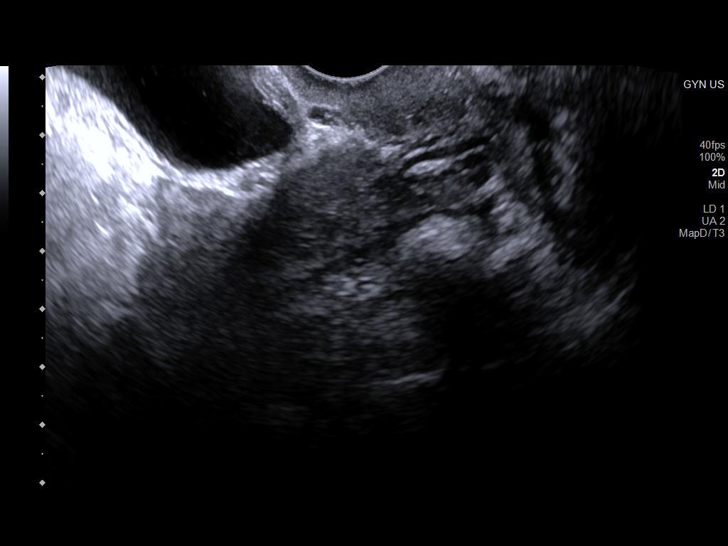
[im 56/67]
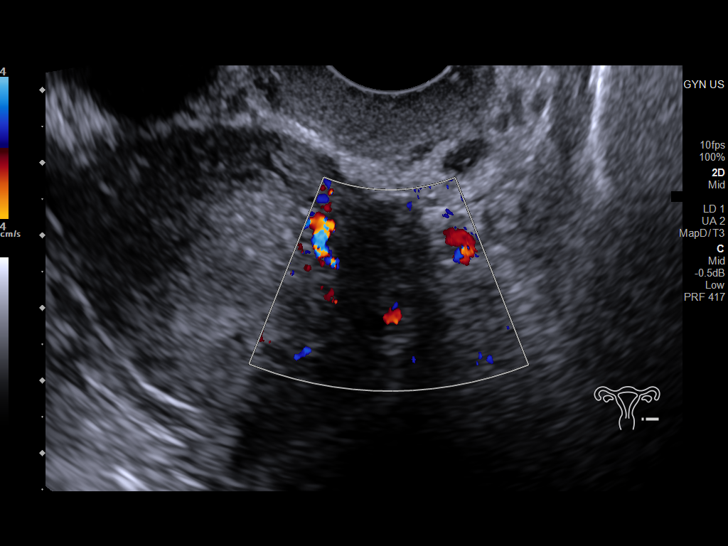
[im 61/67]
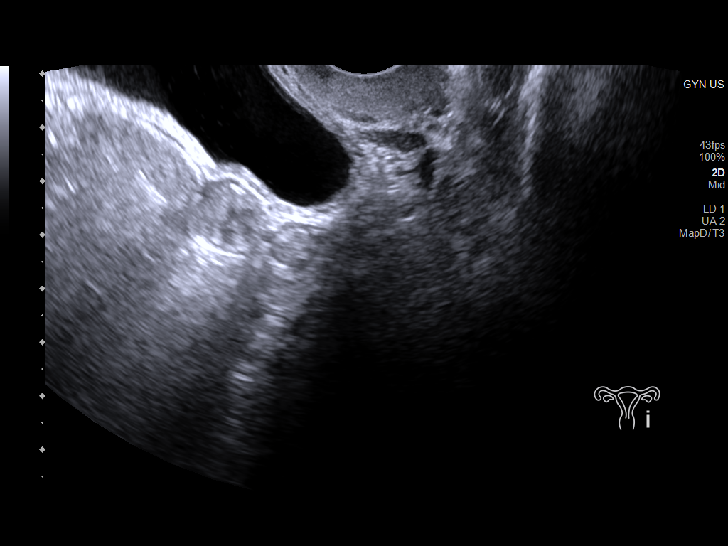
[im 67/67]
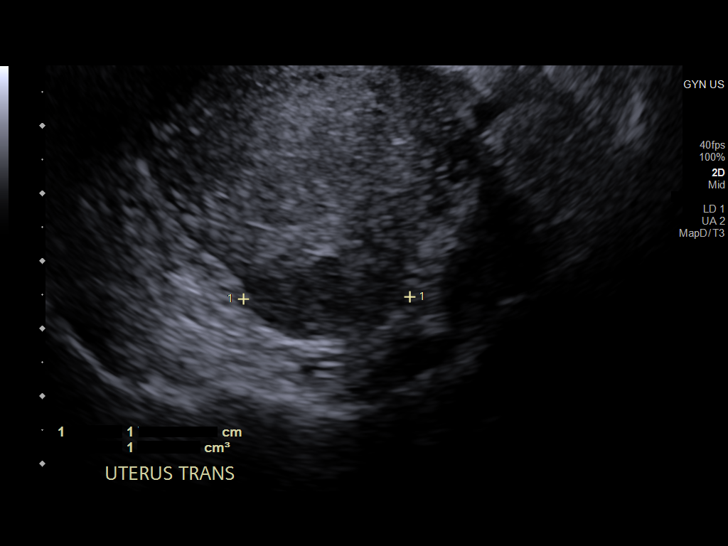

[14 of 25 positions shown; findings below may reference images not displayed]

FINDINGS: Uterus

Measurements: 7.5 x 4.5 x 5 cm = volume: 89.2 mL. Subserosal
posterior fundal fibroid measuring 2 x 1.2 x 2.5 cm.

Endometrium

Thickness: 2 mm.  No focal abnormality visualized.

Right ovary

Measurements: 2.6 x 1.8 x 1.8 cm = volume: 4.3 mL. Normal
appearance/no adnexal mass.

Left ovary

Measurements: 2.5 x 1.3 x 1.4 cm = volume: 2.4 mL. Normal
appearance/no adnexal mass.

Other findings

No abnormal free fluid.
IMPRESSION: 1. Normal premenopausal endometrial thickness
2. Small uterine fibroid

## 2022-04-24 ENCOUNTER — Other Ambulatory Visit: Payer: Self-pay | Admitting: Obstetrics & Gynecology

## 2022-04-26 ENCOUNTER — Telehealth: Payer: Self-pay

## 2022-07-09 ENCOUNTER — Telehealth: Payer: Self-pay

## 2022-07-09 NOTE — Telephone Encounter (Signed)
Telephoned patient at mobile number. Patient answered and asked [facility] (scholarship) to call back at another time.

## 2022-08-16 ENCOUNTER — Telehealth: Payer: Self-pay | Admitting: Gastroenterology

## 2022-08-16 ENCOUNTER — Encounter: Payer: Self-pay | Admitting: Internal Medicine

## 2022-08-16 NOTE — Telephone Encounter (Signed)
Cheryl Graham with [facility] Left a message saying a referral was sent for TCS screening ..... I do see the referral but don't see where a questionnaire was sent to the patient.  If you could please send her one... she is aware if might be a bit before you send since you have been out of the office.

## 2022-08-16 NOTE — Telephone Encounter (Signed)
Referral is for Heme + stool (blood in stool), so she will need OV, check with Manuela Schwartz to see if she has it. She schedules OV referrals. thanks

## 2022-09-21 ENCOUNTER — Ambulatory Visit: Payer: Medicaid Other | Admitting: Gastroenterology

## 2022-11-09 ENCOUNTER — Ambulatory Visit (INDEPENDENT_AMBULATORY_CARE_PROVIDER_SITE_OTHER): Payer: Medicaid Other | Admitting: Gastroenterology

## 2022-11-09 ENCOUNTER — Encounter: Payer: Self-pay | Admitting: Gastroenterology

## 2022-11-09 VITALS — BP 145/85 | HR 109 | Temp 97.9°F | Ht 62.0 in | Wt 171.8 lb

## 2022-11-09 DIAGNOSIS — Z1211 Encounter for screening for malignant neoplasm of colon: Secondary | ICD-10-CM

## 2022-11-09 DIAGNOSIS — K219 Gastro-esophageal reflux disease without esophagitis: Secondary | ICD-10-CM

## 2022-11-09 DIAGNOSIS — R140 Abdominal distension (gaseous): Secondary | ICD-10-CM

## 2022-11-09 DIAGNOSIS — R1310 Dysphagia, unspecified: Secondary | ICD-10-CM | POA: Diagnosis not present

## 2022-11-09 DIAGNOSIS — K5904 Chronic idiopathic constipation: Secondary | ICD-10-CM

## 2022-11-09 DIAGNOSIS — R195 Other fecal abnormalities: Secondary | ICD-10-CM

## 2022-11-09 MED ORDER — PANTOPRAZOLE SODIUM 40 MG PO TBEC: 40.0000 mg | DELAYED_RELEASE_TABLET | Freq: Every day | ORAL | 3 refills | Status: AC

## 2022-11-09 NOTE — H&P (View-Only) (Signed)
GI Office Note    Referring Provider: Health, [facility]* Primary Care Physician:  [facility], [place]  Primary Gastroenterologist: Cristopher Estimable.Rourk, MD  Chief Complaint   Chief Complaint  Patient presents with   Colonoscopy    Colonoscopy screening and dysphagia. Having trouble swallowing pills.     History of Present Illness   Cheryl Graham is a 57 y.o. female presenting today at the request of [facility], [facility] for heme + stool, constipation, TCS, and to discuss dysphagia.   Labs October 2023 with creatinine 1.0.  Normal LFTs.  1/3 FOBT positive.  Per referral paperwork patient states has a hard time swallowing food, getting it to go down even after drinking liquids.  Also reported loss of taste, hearing, and swelling and requested referral to ENT.  Having trouble with sinus drainage and headaches.  Referral report also notes constipation.  Was started on Linzess 145 mcg twice daily for constipation on 10/07/2022.  Taking famotidine 20 mg daily for gastritis.   Today: Dysphagia, GERD: Having acid reflux symptoms and tasting prior foods and acids. Food and pills are getting stuck in her throat. Sometimes has constant sensation of it sitting in her throat and staying there and some point it goes down. Has had close encounters with vomiting especially in the evenings. Gets nauseas at times after she eats. No lack of appetite or early satiety.   [facility] to [facility] in [place] and states she was diagnosed with H. Pylori. It was causing vomiting for her.   Takes meloxicam as needed for pain.   Heme + stool: No melena or brbpr. Possible history of hemorrhoid.  Constipation: Chronic. Currently taking Linzess 145 mcg twice daily. If she takes them separately she has stools throughout the day. If she takes 2 at one time she has a better relief of bowel movements. Still has some bloating and feelings of incomplete emptying. Has urgency to go at times but  not able to go. States when she feels bloated she has a harder time urinating. Taking famotidine that is helping some with bloating. When she eats she feels bloated but is worse in the evenings. Used to not have a BM but once per week. Sometimes big blow outs and sometimes little amounts.    Current Outpatient Medications  Medication Sig Dispense Refill   Cholecalciferol (VITAMIN D3 PO) Take 5,000 Units by mouth daily.     cyclobenzaprine (FLEXERIL) 10 MG tablet Take 10 mg by mouth at bedtime.     famotidine (PEPCID) 20 MG tablet Take 20 mg by mouth daily.     LINZESS 145 MCG CAPS capsule Take 145 mcg by mouth 2 (two) times daily.     losartan (COZAAR) 50 MG tablet Take 50 mg by mouth daily.     megestrol (MEGACE) 40 MG tablet TAKE ONE TABLET BY MOUTH ONCE DAILY FOR 4 WEEKS.( TAKE 1 WEEK OFF THEN REPEAT) 28 tablet 11   meloxicam (MOBIC) 15 MG tablet Take 15 mg by mouth daily.     montelukast (SINGULAIR) 10 MG tablet Take 10 mg by mouth daily.     pantoprazole (PROTONIX) 40 MG tablet Take 1 tablet (40 mg total) by mouth daily. 90 tablet 3   No current facility-administered medications for this visit.    Past Medical History:  Diagnosis Date   Allergies    Anemia    H pylori ulcer    Hypercholesteremia    Joint pain    Neuropathy  History reviewed. No pertinent surgical history.  Family History  Problem Relation Age of Onset   Stroke Mother    Breast cancer Mother    Diabetes Mother    Diabetes Father    Breast cancer Maternal Aunt    Cancer - Colon Neg Hx    Colon polyps Neg Hx     Allergies as of 11/09/2022   (No Known Allergies)    Social History   Socioeconomic History   Marital status: Divorced    Spouse name: Not on file   Number of children: 0   Years of education: BS   Highest education level: Not on file  Occupational History   Occupation: Company secretary  Tobacco Use   Smoking status: Never   Smokeless tobacco: Never  Vaping Use   Vaping Use: Never used   Substance and Sexual Activity   Alcohol use: No   Drug use: No   Sexual activity: Not Currently    Birth control/protection: None  Other Topics Concern   Not on file  Social History Narrative   Left handed    Soda sometimes   Tea is rare   Social Determinants of Health   Financial Resource Strain: Medium Risk (06/23/2020)   Overall Financial Resource Strain (CARDIA)    Difficulty of Paying Living Expenses: Somewhat hard  Food Insecurity: Food Insecurity Present (06/23/2020)   Hunger Vital Sign    Worried About Running Out of Food in the Last Year: Sometimes true    Ran Out of Food in the Last Year: Sometimes true  Transportation Needs: No Transportation Needs (06/23/2020)   PRAPARE - Hydrologist (Medical): No    Lack of Transportation (Non-Medical): No  Physical Activity: Insufficiently Active (06/23/2020)   Exercise Vital Sign    Days of Exercise per Week: 2 days    Minutes of Exercise per Session: 30 min  Stress: No Stress Concern Present (06/23/2020)   [place]    Feeling of Stress : Not at all  Social Connections: Moderately Integrated (06/23/2020)   Social Connection and Isolation Panel [NHANES]    Frequency of Communication with Friends and Family: More than three times a week    Frequency of Social Gatherings with Friends and Family: More than three times a week    Attends Religious Services: More than 4 times per year    Active Member of [employer] or Organizations: Yes    Attends Music therapist: More than 4 times per year    Marital Status: Divorced  Intimate Partner Violence: Not At Risk (06/23/2020)   Humiliation, Afraid, Rape, and Kick questionnaire    Fear of Current or Ex-Partner: No    Emotionally Abused: No    Physically Abused: No    Sexually Abused: No     Review of Systems   Gen: Denies any fever, chills, fatigue, weight loss, lack of appetite.   CV: Denies chest pain, heart palpitations, peripheral edema, syncope.  Resp: Denies shortness of breath at rest or with exertion. Denies wheezing or cough.  GI: see HPI GU : Denies urinary burning, urinary frequency, urinary hesitancy MS: Denies joint pain, muscle weakness, cramps, or limitation of movement.  Derm: Denies rash, itching, dry skin Psych: Denies depression, anxiety, memory loss, and confusion Heme: Denies bruising, bleeding, and enlarged lymph nodes.   Physical Exam   BP (!) 145/85 (BP Location: Left Arm, Patient Position: Sitting, Cuff Size: Normal)  Pulse (!) 109   Temp 97.9 F (36.6 C) (Temporal)   Ht '5\' 2"'$  (1.575 m)   Wt 171 lb 12.8 oz (77.9 kg)   SpO2 98%   BMI 31.42 kg/m   General:   Alert and oriented. Pleasant and cooperative. Well-nourished and well-developed.  Head:  Normocephalic and atraumatic. Eyes:  Without icterus, sclera clear and conjunctiva pink.  Ears:  Normal auditory acuity. Mouth:  No deformity or lesions, oral mucosa pink.  Lungs:  Clear to auscultation bilaterally. No wheezes, rales, or rhonchi. No distress.  Heart:  S1, S2 present without murmurs appreciated.  Abdomen:  +BS, soft, non-distended.  Mild ttp to left lower quadrant.  No HSM noted. No guarding or rebound. No masses appreciated.  Rectal:  Deferred  Msk:  Symmetrical without gross deformities. Normal posture. Extremities:  Without edema. Neurologic:  Alert and  oriented x4;  grossly normal neurologically. Skin:  Intact without significant lesions or rashes. Psych:  Alert and cooperative. Normal mood and affect.   Assessment   Cheryl Graham is a 57 y.o. female with a history of HTN, anemia, allergies, dyslipidemia, neuropathy presenting today for evaluation of heme positive stool and dysphagia.   Dysphagia, GERD: Having globus sensation at times after eating feeling like it takes all day for food to go down to her stomach.  Is able to tolerate liquids and continue to  eat despite the sensation.  At times pills get stuck in her throat.  No regurgitation of undigested food.  Does have some occasional nausea after eating however denies any lack of appetite or early satiety.  Also with occasional burning sensation with acid regurgitation into her mouth.  At times when laying down at night her symptoms worsen.  Denies frequent NSAID use or alcohol use.  No significant improvement with famotidine.  Will start pantoprazole 40 mg daily, 30 minutes prior to meals.  GERD diet discussed.  Given her dysphagia as well as reflux we will evaluate further with an EGD plus possible dilation.  Constipation: He is to only have about 1 stool per week.  Has been taking Linzess 290 mcg daily, sometimes it once and sometimes half dose split throughout the day.  Still having some bloating and feelings of incomplete emptying at times as well as some occasional urgency.  Bloating is a bit more significant feeling like this causes her to be unable to urinate.  Does report significant improvement since starting Linzess however.  Advised to continue current dosing but may take dose all at once and add daily fiber supplement such as Benefiber.  Will check CBC, CMP, TSH, celiac serologies to assess for additional cause of bloating.   Heme positive stool, screening for colon cancer: FOBT testing revealed 1 out of 3 stool cards positive.  Denies any melena, BRBPR, lack of appetite, early satiety, unintentional weight loss, diarrhea.  Does have constipation as stated above.  Denies any family history of colon cancer or colon polyps.  Given her constipation she will have an augmented prep.   PLAN   Proceed with upper endoscopy +/- dilation and colonoscopy with propofol by Dr. Gala Romney in near future: the risks, benefits, and alternatives have been discussed with the patient in detail. The patient states understanding and desires to proceed. ASA 2  Extra half day clears. Continue Linzess 290 mcg  daily. UPT GERD diet/lifestyle modifications Start pantoprazole 40 mg daily, 30 minutes prior to meals. May use famotidine nightly for breakthrough symptoms.  Continue Linzess 290 mcg daily.  Add daily fiber supplement like benefiber.  Celiac serologies, TSH, CBC, CMP May use over-the-counter Gas-X as needed. Follow up 2 months post procedure.    Venetia Night, MSN, FNP-BC, AGACNP-BC [facility]

## 2022-11-09 NOTE — Progress Notes (Signed)
GI Office Note    Referring Provider: [facility], [facility] Lefort* Primary Care Physician:  [facility], [place]  Primary Gastroenterologist: Cristopher Estimable.Rourk, MD  Chief Complaint   Chief Complaint  Patient presents with   Colonoscopy    Colonoscopy screening and dysphagia. Having trouble swallowing pills.     History of Present Illness   Cheryl Graham is a 57 y.o. female presenting today at the request of [facility], [facility] for heme + stool, constipation, TCS, and to discuss dysphagia.   Labs October 2023 with creatinine 1.0.  Normal LFTs.  1/3 FOBT positive.  Per referral paperwork patient states has a hard time swallowing food, getting it to go down even after drinking liquids.  Also reported loss of taste, hearing, and swelling and requested referral to ENT.  Having trouble with sinus drainage and headaches.  Referral report also notes constipation.  Was started on Linzess 145 mcg twice daily for constipation on 10/07/2022.  Taking famotidine 20 mg daily for gastritis.   Today: Dysphagia, GERD: Having acid reflux symptoms and tasting prior foods and acids. Food and pills are getting stuck in her throat. Sometimes has constant sensation of it sitting in her throat and staying there and some point it goes down. Has had close encounters with vomiting especially in the evenings. Gets nauseas at times after she eats. No lack of appetite or early satiety.   Went to [facility] in [place] and states she was diagnosed with H. Pylori. It was causing vomiting for her.   Takes meloxicam as needed for pain.   Heme + stool: No melena or brbpr. Possible history of hemorrhoid.  Constipation: Chronic. Currently taking Linzess 145 mcg twice daily. If she takes them separately she has stools throughout the day. If she takes 2 at one time she has a better relief of bowel movements. Still has some bloating and feelings of incomplete emptying. Has urgency to go at times but  not able to go. States when she feels bloated she has a harder time urinating. Taking famotidine that is helping some with bloating. When she eats she feels bloated but is worse in the evenings. Used to not have a BM but once per week. Sometimes big blow outs and sometimes little amounts.    Current Outpatient Medications  Medication Sig Dispense Refill   Cholecalciferol (VITAMIN D3 PO) Take 5,000 Units by mouth daily.     cyclobenzaprine (FLEXERIL) 10 MG tablet Take 10 mg by mouth at bedtime.     famotidine (PEPCID) 20 MG tablet Take 20 mg by mouth daily.     LINZESS 145 MCG CAPS capsule Take 145 mcg by mouth 2 (two) times daily.     losartan (COZAAR) 50 MG tablet Take 50 mg by mouth daily.     megestrol (MEGACE) 40 MG tablet TAKE ONE TABLET BY MOUTH ONCE DAILY FOR 4 WEEKS.( TAKE 1 WEEK OFF THEN REPEAT) 28 tablet 11   meloxicam (MOBIC) 15 MG tablet Take 15 mg by mouth daily.     montelukast (SINGULAIR) 10 MG tablet Take 10 mg by mouth daily.     pantoprazole (PROTONIX) 40 MG tablet Take 1 tablet (40 mg total) by mouth daily. 90 tablet 3   No current facility-administered medications for this visit.    Past Medical History:  Diagnosis Date   Allergies    Anemia    H pylori ulcer    Hypercholesteremia    Joint pain    Neuropathy  History reviewed. No pertinent surgical history.  Family History  Problem Relation Age of Onset   Stroke Mother    Breast cancer Mother    Diabetes Mother    Diabetes Father    Breast cancer Maternal Aunt    Cancer - Colon Neg Hx    Colon polyps Neg Hx     Allergies as of 11/09/2022   (No Known Allergies)    Social History   Socioeconomic History   Marital status: Divorced    Spouse name: Not on file   Number of children: 0   Years of education: BS   Highest education level: Not on file  Occupational History   Occupation: Company secretary  Tobacco Use   Smoking status: Never   Smokeless tobacco: Never  Vaping Use   Vaping Use: Never used   Substance and Sexual Activity   Alcohol use: No   Drug use: No   Sexual activity: Not Currently    Birth control/protection: None  Other Topics Concern   Not on file  Social History Narrative   Left handed    Soda sometimes   Tea is rare   Social Determinants of Health   Financial Resource Strain: Medium Risk (06/23/2020)   Overall Financial Resource Strain (CARDIA)    Difficulty of Paying Living Expenses: Somewhat hard  Food Insecurity: Food Insecurity Present (06/23/2020)   Hunger Vital Sign    Worried About Running Out of Food in the Last Year: Sometimes true    Ran Out of Food in the Last Year: Sometimes true  Transportation Needs: No Transportation Needs (06/23/2020)   PRAPARE - Hydrologist (Medical): No    Lack of Transportation (Non-Medical): No  Physical Activity: Insufficiently Active (06/23/2020)   Exercise Vital Sign    Days of Exercise per Week: 2 days    Minutes of Exercise per Session: 30 min  Stress: No Stress Concern Present (06/23/2020)   [place]    Feeling of Stress : Not at all  Social Connections: Moderately Integrated (06/23/2020)   Social Connection and Isolation Panel [NHANES]    Frequency of Communication with Friends and Family: More than three times a week    Frequency of Social Gatherings with Friends and Family: More than three times a week    Attends Religious Services: More than 4 times per year    Active Member of [employer] or Organizations: Yes    Attends Music therapist: More than 4 times per year    Marital Status: Divorced  Intimate Partner Violence: Not At Risk (06/23/2020)   Humiliation, Afraid, Rape, and Kick questionnaire    Fear of Current or Ex-Partner: No    Emotionally Abused: No    Physically Abused: No    Sexually Abused: No     Review of Systems   Gen: Denies any fever, chills, fatigue, weight loss, lack of appetite.   CV: Denies chest pain, heart palpitations, peripheral edema, syncope.  Resp: Denies shortness of breath at rest or with exertion. Denies wheezing or cough.  GI: see HPI GU : Denies urinary burning, urinary frequency, urinary hesitancy MS: Denies joint pain, muscle weakness, cramps, or limitation of movement.  Derm: Denies rash, itching, dry skin Psych: Denies depression, anxiety, memory loss, and confusion Heme: Denies bruising, bleeding, and enlarged lymph nodes.   Physical Exam   BP (!) 145/85 (BP Location: Left Arm, Patient Position: Sitting, Cuff Size: Normal)  Pulse (!) 109   Temp 97.9 F (36.6 C) (Temporal)   Ht '5\' 2"'$  (1.575 m)   Wt 171 lb 12.8 oz (77.9 kg)   SpO2 98%   BMI 31.42 kg/m   General:   Alert and oriented. Pleasant and cooperative. Well-nourished and well-developed.  Head:  Normocephalic and atraumatic. Eyes:  Without icterus, sclera clear and conjunctiva pink.  Ears:  Normal auditory acuity. Mouth:  No deformity or lesions, oral mucosa pink.  Lungs:  Clear to auscultation bilaterally. No wheezes, rales, or rhonchi. No distress.  Heart:  S1, S2 present without murmurs appreciated.  Abdomen:  +BS, soft, non-distended.  Mild ttp to left lower quadrant.  No HSM noted. No guarding or rebound. No masses appreciated.  Rectal:  Deferred  Msk:  Symmetrical without gross deformities. Normal posture. Extremities:  Without edema. Neurologic:  Alert and  oriented x4;  grossly normal neurologically. Skin:  Intact without significant lesions or rashes. Psych:  Alert and cooperative. Normal mood and affect.   Assessment   Cheryl Graham is a 58 y.o. female with a history of HTN, anemia, allergies, dyslipidemia, neuropathy presenting today for evaluation of heme positive stool and dysphagia.   Dysphagia, GERD: Having globus sensation at times after eating feeling like it takes all day for food to go down to her stomach.  Is able to tolerate liquids and continue to  eat despite the sensation.  At times pills get stuck in her throat.  No regurgitation of undigested food.  Does have some occasional nausea after eating however denies any lack of appetite or early satiety.  Also with occasional burning sensation with acid regurgitation into her mouth.  At times when laying down at night her symptoms worsen.  Denies frequent NSAID use or alcohol use.  No significant improvement with famotidine.  Will start pantoprazole 40 mg daily, 30 minutes prior to meals.  GERD diet discussed.  Given her dysphagia as well as reflux we will evaluate further with an EGD plus possible dilation.  Constipation: He is to only have about 1 stool per week.  Has been taking Linzess 290 mcg daily, sometimes it once and sometimes half dose split throughout the day.  Still having some bloating and feelings of incomplete emptying at times as well as some occasional urgency.  Bloating is a bit more significant feeling like this causes her to be unable to urinate.  Does report significant improvement since starting Linzess however.  Advised to continue current dosing but may take dose all at once and add daily fiber supplement such as Benefiber.  Will check CBC, CMP, TSH, celiac serologies to assess for additional cause of bloating.   Heme positive stool, screening for colon cancer: FOBT testing revealed 1 out of 3 stool cards positive.  Denies any melena, BRBPR, lack of appetite, early satiety, unintentional weight loss, diarrhea.  Does have constipation as stated above.  Denies any family history of colon cancer or colon polyps.  Given her constipation she will have an augmented prep.   PLAN   Proceed with upper endoscopy +/- dilation and colonoscopy with propofol by Dr. Gala Romney in near future: the risks, benefits, and alternatives have been discussed with the patient in detail. The patient states understanding and desires to proceed. ASA 2  Extra half day clears. Continue Linzess 290 mcg  daily. UPT GERD diet/lifestyle modifications Start pantoprazole 40 mg daily, 30 minutes prior to meals. May use famotidine nightly for breakthrough symptoms.  Continue Linzess 290 mcg daily.  Add daily fiber supplement like benefiber.  Celiac serologies, TSH, CBC, CMP May use over-the-counter Gas-X as needed. Follow up 2 months post procedure.    Venetia Night, MSN, FNP-BC, AGACNP-BC [facility]

## 2022-11-09 NOTE — Patient Instructions (Addendum)
Start pantoprazole 40 mg daily, 30 minutes prior to meals. May continue taking famotidine 20 mg nightly.   Follow a GERD diet:  Avoid fried, fatty, greasy, spicy, citrus foods. Avoid caffeine and carbonated beverages. Avoid chocolate. Try eating 4-6 small meals a day rather than 3 large meals. Do not eat within 3 hours of laying down. Prop head of bed up on wood or bricks to create a 6 inch incline.  Continue Linzess 290 mcg daily. Start daily fiber supplement like benefiber 2-3 teaspoons daily.   We are scheduling you for a colonoscopy and EGD with possible dilation in the near future with Dr. Gala Romney. Will need an extra half day of clear liquids and continue your Linzess 290 mcg daily (2- 145 mcg tablets).   I am ordering labs for you to have completed to evaluate your constipation and bloating. ([facility]) [address] across the street from [facility] emergency department.  We will follow up 2 months after your procedures.   It was a pleasure to see you today. I want to create trusting relationships with patients. If you receive a survey regarding your visit,  I greatly appreciate you taking time to fill this out on paper or through your MyChart. I value your feedback.  Venetia Night, MSN, FNP-BC, AGACNP-BC [facility]

## 2022-11-10 ENCOUNTER — Telehealth: Payer: Self-pay | Admitting: *Deleted

## 2022-11-10 DIAGNOSIS — K219 Gastro-esophageal reflux disease without esophagitis: Secondary | ICD-10-CM

## 2022-11-10 DIAGNOSIS — R195 Other fecal abnormalities: Secondary | ICD-10-CM

## 2022-11-10 DIAGNOSIS — R1310 Dysphagia, unspecified: Secondary | ICD-10-CM

## 2022-11-10 LAB — CBC
HCT: 40.4 % (ref 35.0–45.0)
Hemoglobin: 13.9 g/dL (ref 11.7–15.5)
MCH: 29.4 pg (ref 27.0–33.0)
MCHC: 34.4 g/dL (ref 32.0–36.0)
MCV: 85.6 fL (ref 80.0–100.0)
MPV: 10.1 fL (ref 7.5–12.5)
Platelets: 338 10*3/uL (ref 140–400)
RBC: 4.72 10*6/uL (ref 3.80–5.10)
RDW: 13.3 % (ref 11.0–15.0)
WBC: 5.3 10*3/uL (ref 3.8–10.8)

## 2022-11-10 LAB — COMPREHENSIVE METABOLIC PANEL
AG Ratio: 1.9 (calc) (ref 1.0–2.5)
ALT: 35 U/L — ABNORMAL HIGH (ref 6–29)
AST: 30 U/L (ref 10–35)
Albumin: 5 g/dL (ref 3.6–5.1)
Alkaline phosphatase (APISO): 50 U/L (ref 37–153)
BUN/Creatinine Ratio: 10 (calc) (ref 6–22)
BUN: 10 mg/dL (ref 7–25)
CO2: 20 mmol/L (ref 20–32)
Calcium: 9.8 mg/dL (ref 8.6–10.4)
Chloride: 108 mmol/L (ref 98–110)
Creat: 1.05 mg/dL — ABNORMAL HIGH (ref 0.50–1.03)
Globulin: 2.7 g/dL (calc) (ref 1.9–3.7)
Glucose, Bld: 117 mg/dL — ABNORMAL HIGH (ref 65–99)
Potassium: 4.2 mmol/L (ref 3.5–5.3)
Sodium: 140 mmol/L (ref 135–146)
Total Bilirubin: 0.4 mg/dL (ref 0.2–1.2)
Total Protein: 7.7 g/dL (ref 6.1–8.1)

## 2022-11-10 LAB — TSH: TSH: 1.39 mIU/L

## 2022-11-10 LAB — TISSUE TRANSGLUTAMINASE, IGA: (tTG) Ab, IgA: <1 U/mL

## 2022-11-10 LAB — IGA: Immunoglobulin A: 148 mg/dL (ref 47–310)

## 2022-11-10 MED ORDER — PEG 3350-KCL-NA BICARB-NACL 420 G PO SOLR: 4000.0000 mL | Freq: Once | ORAL | 0 refills | Status: AC

## 2022-11-10 NOTE — Telephone Encounter (Signed)
Spoke with pt. Scheduled for TCS/EGD +/-ED with Dr. Gala Romney, ASA 2 on 2/8 at 11am. Aware will need UPT prior. Rx for prep sent to [facility] pharmacy([address] per pt request). Instructions sent also.

## 2022-11-11 ENCOUNTER — Ambulatory Visit: Payer: Medicaid Other | Admitting: Gastroenterology

## 2022-12-02 ENCOUNTER — Encounter: Payer: Self-pay | Admitting: Internal Medicine

## 2022-12-08 ENCOUNTER — Other Ambulatory Visit (HOSPITAL_COMMUNITY)
Admission: RE | Admit: 2022-12-08 | Discharge: 2022-12-08 | Disposition: A | Discharge status: Home / Self Care | Payer: Medicaid Other | Source: Ambulatory Visit | Attending: Internal Medicine | Admitting: Internal Medicine

## 2022-12-08 ENCOUNTER — Telehealth: Payer: Self-pay | Admitting: *Deleted

## 2022-12-08 DIAGNOSIS — R195 Other fecal abnormalities: Secondary | ICD-10-CM | POA: Diagnosis present

## 2022-12-08 DIAGNOSIS — K219 Gastro-esophageal reflux disease without esophagitis: Secondary | ICD-10-CM

## 2022-12-08 DIAGNOSIS — R1310 Dysphagia, unspecified: Secondary | ICD-10-CM | POA: Insufficient documentation

## 2022-12-08 LAB — PREGNANCY, URINE: Preg Test, Ur: NEGATIVE

## 2022-12-08 NOTE — Telephone Encounter (Signed)
Received message from Endo: Cheryl Graham for tomorrow does not have anybody to be with her here at the hospital. She is using transportation to get here. We cannot do the procedure if she does not have a adult in the building. Can you call her and reiterate this to her?  Called and informed pt that she will need to have an adult in the building with her at the time of procedure or her procedure would be cancelled. She states she is on the other line with someone and would call back.

## 2022-12-09 ENCOUNTER — Other Ambulatory Visit: Payer: Self-pay

## 2022-12-09 ENCOUNTER — Ambulatory Visit (HOSPITAL_BASED_OUTPATIENT_CLINIC_OR_DEPARTMENT_OTHER): Payer: Medicaid Other | Admitting: Certified Registered"

## 2022-12-09 ENCOUNTER — Ambulatory Visit (HOSPITAL_COMMUNITY): Payer: Medicaid Other | Admitting: Certified Registered"

## 2022-12-09 ENCOUNTER — Ambulatory Visit (HOSPITAL_COMMUNITY)
Admission: RE | Admit: 2022-12-09 | Discharge: 2022-12-09 | Disposition: A | Discharge status: Home / Self Care | Payer: Medicaid Other | Attending: Internal Medicine | Admitting: Internal Medicine

## 2022-12-09 ENCOUNTER — Encounter (HOSPITAL_COMMUNITY): Payer: Self-pay | Admitting: Internal Medicine

## 2022-12-09 ENCOUNTER — Encounter (HOSPITAL_COMMUNITY)
Admission: RE | Disposition: A | Discharge status: Home / Self Care | Payer: Self-pay | Source: Home / Self Care | Attending: Internal Medicine

## 2022-12-09 DIAGNOSIS — R1310 Dysphagia, unspecified: Secondary | ICD-10-CM

## 2022-12-09 DIAGNOSIS — D649 Anemia, unspecified: Secondary | ICD-10-CM | POA: Insufficient documentation

## 2022-12-09 DIAGNOSIS — K219 Gastro-esophageal reflux disease without esophagitis: Secondary | ICD-10-CM | POA: Insufficient documentation

## 2022-12-09 DIAGNOSIS — D122 Benign neoplasm of ascending colon: Secondary | ICD-10-CM | POA: Diagnosis not present

## 2022-12-09 DIAGNOSIS — K921 Melena: Secondary | ICD-10-CM | POA: Diagnosis present

## 2022-12-09 DIAGNOSIS — E785 Hyperlipidemia, unspecified: Secondary | ICD-10-CM | POA: Diagnosis not present

## 2022-12-09 DIAGNOSIS — K5730 Diverticulosis of large intestine without perforation or abscess without bleeding: Secondary | ICD-10-CM

## 2022-12-09 DIAGNOSIS — I10 Essential (primary) hypertension: Secondary | ICD-10-CM | POA: Insufficient documentation

## 2022-12-09 DIAGNOSIS — D127 Benign neoplasm of rectosigmoid junction: Secondary | ICD-10-CM

## 2022-12-09 DIAGNOSIS — K279 Peptic ulcer, site unspecified, unspecified as acute or chronic, without hemorrhage or perforation: Secondary | ICD-10-CM | POA: Insufficient documentation

## 2022-12-09 DIAGNOSIS — K635 Polyp of colon: Secondary | ICD-10-CM | POA: Diagnosis not present

## 2022-12-09 DIAGNOSIS — R195 Other fecal abnormalities: Secondary | ICD-10-CM | POA: Diagnosis not present

## 2022-12-09 HISTORY — PX: ESOPHAGOGASTRODUODENOSCOPY (EGD) WITH PROPOFOL: SHX5813

## 2022-12-09 HISTORY — PX: POLYPECTOMY: SHX5525

## 2022-12-09 HISTORY — PX: MALONEY DILATION: SHX5535

## 2022-12-09 HISTORY — PX: COLONOSCOPY WITH PROPOFOL: SHX5780

## 2022-12-09 SURGERY — COLONOSCOPY WITH PROPOFOL
Anesthesia: General

## 2022-12-09 MED ORDER — PROPOFOL 10 MG/ML IV BOLUS
INTRAVENOUS | Status: DC | PRN
  Administered 2022-12-09: 30 mg via INTRAVENOUS
  Administered 2022-12-09: 40 mg via INTRAVENOUS
  Administered 2022-12-09: 130 mg via INTRAVENOUS
  Administered 2022-12-09 (×3): 40 mg via INTRAVENOUS

## 2022-12-09 MED ORDER — LACTATED RINGERS IV SOLN: INTRAVENOUS | Status: DC

## 2022-12-09 MED ORDER — LIDOCAINE HCL (CARDIAC) PF 100 MG/5ML IV SOSY
PREFILLED_SYRINGE | INTRAVENOUS | Status: DC | PRN
  Administered 2022-12-09: 50 mg via INTRAVENOUS

## 2022-12-09 MED ORDER — STERILE WATER FOR IRRIGATION IR SOLN
Status: DC | PRN
  Administered 2022-12-09: 120 mL

## 2022-12-09 NOTE — Anesthesia Preprocedure Evaluation (Signed)
Anesthesia Evaluation  Patient identified by MRN, date of birth, ID band Patient awake    Reviewed: Allergy & Precautions, H&P , NPO status , Patient's Chart, lab work & pertinent test results, reviewed documented beta blocker date and time   Airway Mallampati: II  TM Distance: >3 FB Neck ROM: full    Dental no notable dental hx.    Pulmonary neg pulmonary ROS   Pulmonary exam normal breath sounds clear to auscultation       Cardiovascular Exercise Tolerance: Good negative cardio ROS  Rhythm:regular Rate:Normal     Neuro/Psych negative neurological ROS  negative psych ROS   GI/Hepatic negative GI ROS, Neg liver ROS, PUD,,,  Endo/Other  negative endocrine ROS    Renal/GU negative Renal ROS  negative genitourinary   Musculoskeletal   Abdominal   Peds  Hematology negative hematology ROS (+) Blood dyscrasia, anemia   Anesthesia Other Findings   Reproductive/Obstetrics negative OB ROS                             Anesthesia Physical Anesthesia Plan  ASA: 2  Anesthesia Plan: General   Post-op Pain Management:    Induction:   PONV Risk Score and Plan: Propofol infusion  Airway Management Planned:   Additional Equipment:   Intra-op Plan:   Post-operative Plan:   Informed Consent: I have reviewed the patients History and Physical, chart, labs and discussed the procedure including the risks, benefits and alternatives for the proposed anesthesia with the patient or authorized representative who has indicated his/her understanding and acceptance.     Dental Advisory Given  Plan Discussed with: CRNA  Anesthesia Plan Comments:        Anesthesia Quick Evaluation

## 2022-12-09 NOTE — Interval H&P Note (Signed)
History and Physical Interval Note:  12/09/2022 9:34 AM  Cheryl Graham  has presented today for surgery, with the diagnosis of dysphagia, gerd, heme positive stool.  The various methods of treatment have been discussed with the patient and family. After consideration of risks, benefits and other options for treatment, the patient has consented to  Procedure(s) with comments: COLONOSCOPY WITH PROPOFOL (N/A) - 11:00am, asa 2 ESOPHAGOGASTRODUODENOSCOPY (EGD) WITH PROPOFOL (N/A) MALONEY DILATION (N/A) as a surgical intervention.  The patient's history has been reviewed, patient examined, no change in status, stable for surgery.  I have reviewed the patient's chart and labs.  Questions were answered to the patient's satisfaction.     Kristyl Athens    no change.   >  Diagnostic colonoscopy for Hemoccult positive stool EGD with possible esophageal dilation as feasible/appropriate per plan.  The risks, benefits, limitations, imponderables and alternatives regarding both EGD and colonoscopy have been reviewed with the patient. Questions have been answered. All parties agreeable.

## 2022-12-09 NOTE — Op Note (Signed)
[facility] Patient Name: Cheryl Graham Procedure Date: 12/09/2022 10:02 AM MRN: [id number] Date of Birth: [birth year] Attending MD: Norvel Richards , MD, [id number] CSN: [id number] Age: 57 Admit Type: Outpatient Procedure:                Colonoscopy Indications:              Heme positive stool Providers:                Norvel Richards, MD, Lambert Mody, Ladoris Gene Technician, Technician Referring MD:              Medicines:                Propofol per Anesthesia Complications:            No immediate complications. Estimated Blood Loss:     Estimated blood loss was minimal. Procedure:                Pre-Anesthesia Assessment:                           - Prior to the procedure, a History and Physical                            was performed, and patient medications and                            allergies were reviewed. The patient's tolerance of                            previous anesthesia was also reviewed. The risks                            and benefits of the procedure and the sedation                            options and risks were discussed with the patient.                            All questions were answered, and informed consent                            was obtained. Prior Anticoagulants: The patient has                            taken no anticoagulant or antiplatelet agents. ASA                            Grade Assessment: II - A patient with mild systemic                            disease. After reviewing the risks and benefits,  the patient was deemed in satisfactory condition to                            undergo the procedure.                           After obtaining informed consent, the colonoscope                            was passed under direct vision. Throughout the                            procedure, the patient's blood pressure, pulse, and                             oxygen saturations were monitored continuously. The                            [id number]) scope was introduced through                            the anus and advanced to the the cecum, identified                            by appendiceal orifice and ileocecal valve. The                            colonoscopy was performed without difficulty. The                            patient tolerated the procedure well. The quality                            of the bowel preparation was adequate. The entire                            colon was well visualized. Scope In: 10:04:12 AM Scope Out: 10:17:10 AM Scope Withdrawal Time: 0 hours 9 minutes 29 seconds  Total Procedure Duration: 0 hours 12 minutes 58 seconds  Findings:      The perianal and digital rectal examinations were normal.      Scattered medium-mouthed diverticula were found in the sigmoid colon and       descending colon.      The exam was otherwise without abnormality on direct and retroflexion       views.      Three sessile polyps were found in the recto-sigmoid colon and ascending       colon. The polyps were 3 to 4 mm in size. These polyps were removed with       a cold snare. Resection and retrieval were complete. Estimated blood       loss was minimal.      The exam was otherwise without abnormality on direct and retroflexion       views. Impression:               - Diverticulosis in the sigmoid colon and  in the                            descending colon.                           - The examination was otherwise normal on direct                            and retroflexion views.                           - Three 3 to 4 mm polyps at the recto-sigmoid colon                            and in the ascending colon, removed with a cold                            snare. Resected and retrieved.                           - The examination was otherwise normal on direct                            and retroflexion  views. Moderate Sedation:      Moderate (conscious) sedation was personally administered by an       anesthesia professional. The following parameters were monitored: oxygen       saturation, heart rate, blood pressure, respiratory rate, EKG, adequacy       of pulmonary ventilation, and response to care. Recommendation:           - Patient has a contact number available for                            emergencies. The signs and symptoms of potential                            delayed complications were discussed with the                            patient. Return to normal activities tomorrow.                            Written discharge instructions were provided to the                            patient.                           - Advance diet as tolerated.                           - Continue present medications.                           - Patient has a contact number available for  emergencies. The signs and symptoms of potential                            delayed complications were discussed with the                            patient. Return to normal activities tomorrow.                            Written discharge instructions were provided to the                            patient. Procedure Code(s):        --- Professional ---                           [phone], Colonoscopy, flexible; with removal of                            tumor(s), polyp(s), or other lesion(s) by snare                            technique Diagnosis Code(s):        --- Professional ---                           D12.7, Benign neoplasm of rectosigmoid junction                           D12.2, Benign neoplasm of ascending colon                           R19.5, Other fecal abnormalities                           K57.30, Diverticulosis of large intestine without                            perforation or abscess without bleeding CPT copyright 2022 American Medical Association. All rights  reserved. The codes documented in this report are preliminary and upon coder review may  be revised to meet current compliance requirements. Cristopher Estimable. Marcha Licklider, MD Norvel Richards, MD 12/09/2022 10:29:00 AM This report has been signed electronically. Number of Addenda: 0

## 2022-12-09 NOTE — Interval H&P Note (Signed)
History and Physical Interval Note:  12/09/2022 9:49 AM  Cheryl Graham  has presented today for surgery, with the diagnosis of dysphagia, gerd, heme positive stool.  The various methods of treatment have been discussed with the patient and family. After consideration of risks, benefits and other options for treatment, the patient has consented to  Procedure(s) with comments: COLONOSCOPY WITH PROPOFOL (N/A) - 11:00am, asa 2 ESOPHAGOGASTRODUODENOSCOPY (EGD) WITH PROPOFOL (N/A) MALONEY DILATION (N/A) as a surgical intervention.  The patient's history has been reviewed, patient examined, no change in status, stable for surgery.  I have reviewed the patient's chart and labs.  Questions were answered to the patient's satisfaction.     Manus Rudd

## 2022-12-09 NOTE — Discharge Instructions (Signed)
Colonoscopy Discharge Instructions  Read the instructions outlined below and refer to this sheet in the next few weeks. These discharge instructions provide you with general information on caring for yourself after you leave the hospital. Your doctor may also give you specific instructions. While your treatment has been planned according to the most current medical practices available, unavoidable complications occasionally occur. If you have any problems or questions after discharge, call Dr. Gala Romney at [phone]. ACTIVITY You may resume your regular activity, but move at a slower pace for the next 24 hours.  Take frequent rest periods for the next 24 hours.  Walking will help get rid of the air and reduce the bloated feeling in your belly (abdomen).  No driving for 24 hours (because of the medicine (anesthesia) used during the test).   Do not sign any important legal documents or operate any machinery for 24 hours (because of the anesthesia used during the test).  NUTRITION Drink plenty of fluids.  You may resume your normal diet as instructed by your doctor.  Begin with a light meal and progress to your normal diet. Heavy or fried foods are harder to digest and may make you feel sick to your stomach (nauseated).  Avoid alcoholic beverages for 24 hours or as instructed.  MEDICATIONS You may resume your normal medications unless your doctor tells you otherwise.  WHAT YOU CAN EXPECT TODAY Some feelings of bloating in the abdomen.  Passage of more gas than usual.  Spotting of blood in your stool or on the toilet paper.  IF YOU HAD POLYPS REMOVED DURING THE COLONOSCOPY: No aspirin products for 7 days or as instructed.  No alcohol for 7 days or as instructed.  Eat a soft diet for the next 24 hours.  FINDING OUT THE RESULTS OF YOUR TEST Not all test results are available during your visit. If your test results are not back during the visit, make an appointment with your caregiver to find out the  results. Do not assume everything is normal if you have not heard from your caregiver or the medical facility. It is important for you to follow up on all of your test results.  SEEK IMMEDIATE MEDICAL ATTENTION IF: You have more than a spotting of blood in your stool.  Your belly is swollen (abdominal distention).  You are nauseated or vomiting.  You have a temperature over 101.  You have abdominal pain or discomfort that is severe or gets worse throughout the day.   EGD Discharge instructions Please read the instructions outlined below and refer to this sheet in the next few weeks. These discharge instructions provide you with general information on caring for yourself after you leave the hospital. Your doctor may also give you specific instructions. While your treatment has been planned according to the most current medical practices available, unavoidable complications occasionally occur. If you have any problems or questions after discharge, please call your doctor. ACTIVITY You may resume your regular activity but move at a slower pace for the next 24 hours.  Take frequent rest periods for the next 24 hours.  Walking will help expel (get rid of) the air and reduce the bloated feeling in your abdomen.  No driving for 24 hours (because of the anesthesia (medicine) used during the test).  You may shower.  Do not sign any important legal documents or operate any machinery for 24 hours (because of the anesthesia used during the test).  NUTRITION Drink plenty of fluids.  You may  resume your normal diet.  Begin with a light meal and progress to your normal diet.  Avoid alcoholic beverages for 24 hours or as instructed by your caregiver.  MEDICATIONS You may resume your normal medications unless your caregiver tells you otherwise.  WHAT YOU CAN EXPECT TODAY You may experience abdominal discomfort such as a feeling of fullness or "gas" pains.  FOLLOW-UP Your doctor will discuss the results of  your test with you.  SEEK IMMEDIATE MEDICAL ATTENTION IF ANY OF THE FOLLOWING OCCUR: Excessive nausea (feeling sick to your stomach) and/or vomiting.  Severe abdominal pain and distention (swelling).  Trouble swallowing.  Temperature over 101 F (37.8 C).  Rectal bleeding or vomiting of blood.       Your upper GI tract appeared normal;   your colon colon polyp and diverticulosis information provided   further recommendations to follow pending review of pathology report   at patient request, I called Catha Brow at [phone] findings and right

## 2022-12-09 NOTE — Op Note (Signed)
[facility] Patient Name: Cheryl Graham Procedure Date: 12/09/2022 9:28 AM MRN: [id number] Date of Birth: [birth year] Attending MD: Norvel Richards , MD, [id number] CSN: [id number] Age: 57 Admit Type: Outpatient Procedure:                Upper GI endoscopy Indications:              Dysphagia Providers:                Norvel Richards, MD, Lambert Mody,                            Kristine L. Risa Grill, Technician Referring MD:              Medicines:                Propofol per Anesthesia Complications:            No immediate complications. Estimated Blood Loss:     Estimated blood loss was minimal. Procedure:                Pre-Anesthesia Assessment:                           - Prior to the procedure, a History and Physical                            was performed, and patient medications and                            allergies were reviewed. The patient's tolerance of                            previous anesthesia was also reviewed. The risks                            and benefits of the procedure and the sedation                            options and risks were discussed with the patient.                            All questions were answered, and informed consent                            was obtained. Prior Anticoagulants: The patient has                            taken no anticoagulant or antiplatelet agents. ASA                            Grade Assessment: II - A patient with mild systemic                            disease. After reviewing the risks and benefits,  the patient was deemed in satisfactory condition to                            undergo the procedure.                           After obtaining informed consent, the endoscope was                            passed under direct vision. Throughout the                            procedure, the patient's blood pressure, pulse, and                            oxygen  saturations were monitored continuously. The                            [id number]-[id number] ([id number] scope was introduced through the                            mouth, and advanced to the second part of duodenum.                            The upper GI endoscopy was accomplished without                            difficulty. The patient tolerated the procedure                            well. Scope In: 9:51:54 AM Scope Out: 9:58:17 AM Total Procedure Duration: 0 hours 6 minutes 23 seconds  Findings:      The examined esophagus was normal.      The entire examined stomach was normal. Gastric cavity empty. Patent       pylorus.      The duodenal bulb and second portion of the duodenum were normal. The       scope was withdrawn. Dilation was performed with a Maloney dilator with       mild resistance at 65 Fr. The scope was withdrawn. Dilation was       performed with a Maloney dilator with mild resistance at 56 Fr. The       dilation site was examined following endoscope reinsertion and showed no       change. Estimated blood loss was minimal. Single small superficial tear       through the UES mucosa Impression:               - Normal esophagus. Dilated.                           - Normal stomach.                           - Normal duodenal bulb and second portion of the  duodenum.                           - No specimens collected. Moderate Sedation:      Moderate (conscious) sedation was personally administered by an       anesthesia professional. The following parameters were monitored: oxygen       saturation, heart rate, blood pressure, respiratory rate, EKG, adequacy       of pulmonary ventilation, and response to care. Recommendation:           - Patient has a contact number available for                            emergencies. The signs and symptoms of potential                            delayed complications were discussed with the                             patient. Return to normal activities tomorrow.                            Written discharge instructions were provided to the                            patient.                           - Advance diet as tolerated.                           - Continue present medications.                           - Return to my office in 3 months. See colonoscopy                            report. Procedure Code(s):        --- Professional ---                           [phone], Esophagogastroduodenoscopy, flexible,                            transoral; diagnostic, including collection of                            specimen(s) by brushing or washing, when performed                            (separate procedure)                           43450, Dilation of esophagus, by unguided sound or                            bougie, single or multiple passes Diagnosis Code(s):        ---  Professional ---                           R13.10, Dysphagia, unspecified CPT copyright 2022 American Medical Association. All rights reserved. The codes documented in this report are preliminary and upon coder review may  be revised to meet current compliance requirements. Cristopher Estimable. Anthoney Sheppard, MD Norvel Richards, MD 12/09/2022 10:22:23 AM This report has been signed electronically. Number of Addenda: 0

## 2022-12-09 NOTE — Transfer of Care (Signed)
Immediate Anesthesia Transfer of Care Note  Patient: Cheryl Graham  Procedure(s) Performed: COLONOSCOPY WITH PROPOFOL ESOPHAGOGASTRODUODENOSCOPY (EGD) WITH PROPOFOL MALONEY DILATION POLYPECTOMY  Patient Location: Endoscopy Unit  Anesthesia Type:General  Level of Consciousness: awake  Airway & Oxygen Therapy: Patient Spontanous Breathing  Post-op Assessment: Report given to RN and Post -op Vital signs reviewed and stable  Post vital signs: Reviewed and stable  Last Vitals:  Vitals Value Taken Time  BP    Temp    Pulse    Resp    SpO2      Last Pain:  Vitals:   12/09/22 0947  TempSrc:   PainSc: 0-No pain      Patients Stated Pain Goal: 4 (64/38/37 7939)  Complications: No notable events documented.

## 2022-12-10 NOTE — Anesthesia Postprocedure Evaluation (Signed)
Anesthesia Post Note  Patient: Cheryl Graham  Procedure(s) Performed: COLONOSCOPY WITH PROPOFOL ESOPHAGOGASTRODUODENOSCOPY (EGD) WITH PROPOFOL [place] POLYPECTOMY  Patient location during evaluation: Phase II Anesthesia Type: General Level of consciousness: awake Pain management: pain level controlled Vital Signs Assessment: post-procedure vital signs reviewed and stable Respiratory status: spontaneous breathing and respiratory function stable Cardiovascular status: blood pressure returned to baseline and stable Postop Assessment: no headache and no apparent nausea or vomiting Anesthetic complications: no Comments: Late entry   No notable events documented.   Last Vitals:  Vitals:   12/09/22 0901 12/09/22 1021  BP: (!) 129/93 139/78  Pulse: (!) 112 (!) 103  Resp: 12 20  Temp: 36.8 C (!) 36.4 C  SpO2: 100% 100%    Last Pain:  Vitals:   12/09/22 1021  TempSrc: Oral  PainSc: 0-No pain                 Louann Sjogren

## 2022-12-13 ENCOUNTER — Encounter: Payer: Self-pay | Admitting: Internal Medicine

## 2022-12-13 LAB — SURGICAL PATHOLOGY

## 2022-12-16 NOTE — Addendum Note (Signed)
Addendum  created 12/16/22 1243 by Orlie Dakin, CRNA   Intraprocedure Staff edited

## 2022-12-17 ENCOUNTER — Encounter (HOSPITAL_COMMUNITY): Payer: Self-pay | Admitting: Internal Medicine

## 2023-02-04 ENCOUNTER — Encounter: Payer: Self-pay | Admitting: Internal Medicine

## 2023-02-24 ENCOUNTER — Encounter: Payer: Self-pay | Admitting: Gastroenterology

## 2023-02-24 ENCOUNTER — Ambulatory Visit (INDEPENDENT_AMBULATORY_CARE_PROVIDER_SITE_OTHER): Payer: Medicaid Other | Admitting: Gastroenterology

## 2023-02-24 VITALS — BP 120/77 | HR 112 | Temp 98.2°F | Ht 62.0 in | Wt 167.8 lb

## 2023-02-24 DIAGNOSIS — K219 Gastro-esophageal reflux disease without esophagitis: Secondary | ICD-10-CM

## 2023-02-24 DIAGNOSIS — R140 Abdominal distension (gaseous): Secondary | ICD-10-CM

## 2023-02-24 DIAGNOSIS — K5904 Chronic idiopathic constipation: Secondary | ICD-10-CM | POA: Diagnosis not present

## 2023-02-24 DIAGNOSIS — R1310 Dysphagia, unspecified: Secondary | ICD-10-CM | POA: Diagnosis not present

## 2023-02-24 NOTE — Progress Notes (Signed)
GI Office Note    Referring Provider: Health, Matthew Folks* Primary Care Physician:  Health, [facility] Primary Gastroenterologist: Gerrit Friends.Rourk, MD  Date:  02/24/2023  ID:  Cheryl Graham, DOB 22-Apr-1966, MRN [id number]   Chief Complaint   Chief Complaint  Patient presents with   Follow-up    Doing well, no issues.   History of Present Illness  Cheryl Graham is a 57 y.o. female with a history of HTN, anemia, allergies, dyslipidemia, and neuropathy presenting today for follow-up.  Labs October 2023 with creatinine 1.0.  Normal LFTs.  1/3 FOBT positive.  Last office visit 11/09/22.  Patient reported having frequent reflux symptoms, belching and tasting her prior foods and acids.  Food and pills getting stuck in her throat with constant globus sensation.  Has some occasional encounters of vomiting in the afternoons.  He is nauseous at times after eating.  Reported she was recently diagnosed with H. pylori.  Taking meloxicam as needed for pain.  Denies any melena or BRBPR.  Possible history of hemorrhoids.  Noted chronic constipation, currently taking Linzess under 45 mcg twice daily.  States she has better relief with taking them twice daily in place of taking 1 larger dose once a day.  Having a hard time with urination with bloating.  Taking famotidine to help some with bloating.  It is definitely worse in the evenings.  She is scheduled for an upper endoscopy with dilation and colonoscopy.  She was started on pantoprazole 40 mg once daily and to use famotidine nightly for breakthrough symptoms.  Advised to continue Linzess 290 mcg daily.  Advised to add fiber to her diet and a supplement such as Benefiber.  Checking TSH, CBC, CMP, and celiac serologies for workup of bloating.  Advise Gas-X as needed.  Labs 11/09/2022: Creatinine 1.05, CMP otherwise unremarkable.  Normal CBC and TSH.  Celiac screen negative.  EGD 12/09/2022: -Normal esophagus s/p dilation -Normal  stomach -Normal duodenum -Continue current medications -Follow-up in 3 months  Colonoscopy 12/09/2022: -Sigmoid and descending diverticulosis -3 polyps in the rectosigmoid colon and ascending colon -Pathology revealed colonic mucosa with mild superficial hyperplastic change and lymphoid aggregate and hyperplastic polyp -Patient advised repeat in 10 years  Today: Constipation - Taking Linzess 290 mcg once daily and reports lately she has done better taking it at once. Having a BM almost everyday when she takes it. At times she may miss a dose. Bloating is better but still has it every now and then. Has tried tums before to help with the bloating but thinks she may have overdone it with the food.   GERD, dysphagia - has only had 2 episodes of reflux and thinks it may have been related to missing her PPI. Has a little nausea the other day but possibly related to constipation or her megace. She felt better after BM.   Does suffer from frequent allergies and sinus drainage.   Current Outpatient Medications  Medication Sig Dispense Refill   acetaminophen (TYLENOL) 500 MG tablet Take 500-1,000 mg by mouth every 6 (six) hours as needed (pain.).     cyclobenzaprine (FLEXERIL) 10 MG tablet Take 10 mg by mouth daily as needed for muscle spasms.     famotidine (PEPCID) 20 MG tablet Take 20 mg by mouth at bedtime.     fluticasone (FLONASE) 50 MCG/ACT nasal spray Place 1 spray into both nostrils in the morning and at bedtime.     LINZESS 145 MCG CAPS capsule Take 290  mcg by mouth every evening.     losartan (COZAAR) 50 MG tablet Take 50 mg by mouth in the morning.     megestrol (MEGACE) 40 MG tablet TAKE ONE TABLET BY MOUTH ONCE DAILY FOR 4 WEEKS.( TAKE 1 WEEK OFF THEN REPEAT) (Patient taking differently: Take 40 mg by mouth at bedtime.) 28 tablet 11   meloxicam (MOBIC) 15 MG tablet Take 15 mg by mouth daily as needed (pain/inflammation.).     montelukast (SINGULAIR) 10 MG tablet Take 10 mg by mouth in  the morning.     pantoprazole (PROTONIX) 40 MG tablet Take 1 tablet (40 mg total) by mouth daily. 90 tablet 3   No current facility-administered medications for this visit.    Past Medical History:  Diagnosis Date   Allergies    Anemia    H pylori ulcer    Hypercholesteremia    Joint pain    Neuropathy     Past Surgical History:  Procedure Laterality Date   COLONOSCOPY WITH PROPOFOL N/A 12/09/2022   Procedure: COLONOSCOPY WITH PROPOFOL;  Surgeon: Corbin Ade, MD;  [facility];  Service: Endoscopy;  Laterality: N/A;  11:00am, asa 2   ESOPHAGOGASTRODUODENOSCOPY (EGD) WITH PROPOFOL N/A 12/09/2022   Procedure: ESOPHAGOGASTRODUODENOSCOPY (EGD) WITH PROPOFOL;  Surgeon: Corbin Ade, MD;  [facility];  Service: Endoscopy;  Laterality: N/A;   MALONEY DILATION N/A 12/09/2022   Procedure: Elease Hashimoto DILATION;  Surgeon: Corbin Ade, MD;  [facility];  Service: Endoscopy;  Laterality: N/A;   POLYPECTOMY  12/09/2022   Procedure: POLYPECTOMY;  Surgeon: Corbin Ade, MD;  [facility];  Service: Endoscopy;;    Family History  Problem Relation Age of Onset   Stroke Mother    Breast cancer Mother    Diabetes Mother    Diabetes Father    Breast cancer Maternal Aunt    Cancer - Colon Neg Hx    Colon polyps Neg Hx     Allergies as of 02/24/2023   (No Known Allergies)    Social History   Socioeconomic History   Marital status: Divorced    Spouse name: Not on file   Number of children: 0   Years of education: BS   Highest education level: Not on file  Occupational History   Occupation: Heritage manager  Tobacco Use   Smoking status: Never   Smokeless tobacco: Never  Vaping Use   Vaping Use: Never used  Substance and Sexual Activity   Alcohol use: No   Drug use: No   Sexual activity: Not Currently    Birth control/protection: None  Other Topics Concern   Not on file  Social History Narrative   Left handed    Soda sometimes   Tea  is rare   Social Determinants of Health   Financial Resource Strain: Medium Risk (06/23/2020)   Overall Financial Resource Strain (CARDIA)    Difficulty of Paying Living Expenses: Somewhat hard  Food Insecurity: Food Insecurity Present (06/23/2020)   Hunger Vital Sign    Worried About Running Out of Food in the Last Year: Sometimes true    Ran Out of Food in the Last Year: Sometimes true  Transportation Needs: No Transportation Needs (06/23/2020)   PRAPARE - Administrator, Civil Service (Medical): No    Lack of Transportation (Non-Medical): No  Physical Activity: Insufficiently Active (06/23/2020)   Exercise Vital Sign    Days of Exercise per Week: 2 days  Minutes of Exercise per Session: 30 min  Stress: No Stress Concern Present (06/23/2020)   [employer] of Occupational Health - Occupational Stress Questionnaire    Feeling of Stress : Not at all  Social Connections: Moderately Integrated (06/23/2020)   Social Connection and Isolation Panel [NHANES]    Frequency of Communication with Friends and Family: More than three times a week    Frequency of Social Gatherings with Friends and Family: More than three times a week    Attends Religious Services: More than 4 times per year    Active Member of [employer] or Organizations: Yes    Attends Engineer, structural: More than 4 times per year    Marital Status: Divorced     Review of Systems   Gen: Denies fever, chills, anorexia. Denies fatigue, weakness, weight loss.  CV: Denies chest pain, palpitations, syncope, peripheral edema, and claudication. Resp: Denies dyspnea at rest, cough, wheezing, coughing up blood, and pleurisy. GI: See HPI Derm: Denies rash, itching, dry skin Psych: Denies depression, anxiety, memory loss, confusion. No homicidal or suicidal ideation.  Heme: Denies bruising, bleeding, and enlarged lymph nodes.   Physical Exam   BP 120/77 (BP Location: Right Arm, Patient Position: Sitting,  Cuff Size: Normal)   Pulse (!) 112   Temp 98.2 F (36.8 C) (Oral)   Ht  (1.575 m)   Wt 167 lb 12.8 oz (76.1 kg)   SpO2 98%   BMI 30.69 kg/m   General:   Alert and oriented. No distress noted. Pleasant and cooperative.  Head:  Normocephalic and atraumatic. Eyes:  Conjuctiva clear without scleral icterus. Mouth:  Oral mucosa pink and moist. Good dentition. No lesions. Lungs:  Clear to auscultation bilaterally. No wheezes, rales, or rhonchi. No distress.  Heart:  S1, S2 present without murmurs appreciated.  Abdomen:  +BS, soft, non-tender and non-distended. No rebound or guarding. No HSM or masses noted. Rectal: deferred Msk:  Symmetrical without gross deformities. Normal posture. Extremities:  Without edema. Neurologic:  Alert and  oriented x4 Psych:  Alert and cooperative. Normal mood and affect.   Assessment  Cheryl Graham is a 57 y.o. female with a history of GERD, HTN, anemia, allergies, dyslipidemia, and neuropathy presenting today for follow-up.  GERD, dysphagia: Has only had 2 breakthrough episodes of reflux which may be related to missing her medication.  Has been taking PPI once daily and famotidine nightly.  Does report some occasional throat discomfort but suspects this is related to her allergies and sinus drainage.  Recent EGD with normal stomach and duodenum and no obvious esophageal abnormality s/p empiric dilation.  Dysphagia significantly improved since dilation.  Advised patient to continue pantoprazole 40 mg once daily and will reassess in 6 months regarding weaning PPI.  Constipation, bloating: Constipation fairly well-controlled on Linzess 290 mcg once daily.  Bloating has improved.  Occasional use Tums to help with the bloating.  Definitely has an increase of symptoms with overeating.  Denies any overt abdominal pain.  Previous workup with normal TSH and negative celiac panel.  PLAN   Continue pantoprazole 40 mg once daily Continue famotidine  20 mg  nightly GERD diet Continue Linzess 290 mcg daily.  Repeat TCS in 2034 Follow up in 6 months  Brooke Bonito, MSN, FNP-BC, AGACNP-BC [facility]

## 2023-02-24 NOTE — Patient Instructions (Signed)
Continue pantoprazole 40 mg once daily and your famotidine 20 mg nightly.  For bloating you may use Gas-X as needed.  Continue Linzess 290 mcg daily.  If you have any worsening of your constipation I would add a fiber supplement to your regimen.  It was a pleasure to see you today. I want to create trusting relationships with patients. If you receive a survey regarding your visit,  I greatly appreciate you taking time to fill this out on paper or through your MyChart. I value your feedback.  Brooke Bonito, MSN, FNP-BC, AGACNP-BC [facility]

## 2023-04-14 ENCOUNTER — Ambulatory Visit (INDEPENDENT_AMBULATORY_CARE_PROVIDER_SITE_OTHER): Payer: Medicaid Other | Admitting: Obstetrics & Gynecology

## 2023-04-14 ENCOUNTER — Other Ambulatory Visit (HOSPITAL_COMMUNITY)
Admission: RE | Admit: 2023-04-14 | Discharge: 2023-04-14 | Disposition: A | Discharge status: Home / Self Care | Payer: Medicaid Other | Source: Ambulatory Visit | Attending: Obstetrics & Gynecology | Admitting: Obstetrics & Gynecology

## 2023-04-14 ENCOUNTER — Encounter: Payer: Self-pay | Admitting: Obstetrics & Gynecology

## 2023-04-14 VITALS — BP 133/78 | HR 109 | Ht 62.0 in | Wt 169.0 lb

## 2023-04-14 DIAGNOSIS — Z01419 Encounter for gynecological examination (general) (routine) without abnormal findings: Secondary | ICD-10-CM

## 2023-04-14 MED ORDER — MEGESTROL ACETATE 40 MG PO TABS: 40.0000 mg | ORAL_TABLET | Freq: Every day | ORAL | 11 refills | Status: DC

## 2023-04-14 NOTE — Progress Notes (Signed)
Subjective:     Cheryl Graham is a 57 y.o. female here for a routine exam.  No LMP recorded. (Menstrual status: Irregular Periods). G0P0000 Birth Control Method:  none, perimenoapusal/menopausal Menstrual Calendar(currently): regular with withdrawal of megestrol as expected Current complaints: none.   Current acute medical issues:     Recent Gynecologic History No LMP recorded. (Menstrual status: Irregular Periods). Last Pap: 2021,  normal Last mammogram: 2021,  normal  Past Medical History:  Diagnosis Date   Allergies    Anemia    H pylori ulcer    Hypercholesteremia    Joint pain    Neuropathy     Past Surgical History:  Procedure Laterality Date   COLONOSCOPY WITH PROPOFOL N/A 12/09/2022   Procedure: COLONOSCOPY WITH PROPOFOL;  Surgeon: Corbin Ade, MD;  [facility];  Service: Endoscopy;  Laterality: N/A;  11:00am, asa 2   ESOPHAGOGASTRODUODENOSCOPY (EGD) WITH PROPOFOL N/A 12/09/2022   Procedure: ESOPHAGOGASTRODUODENOSCOPY (EGD) WITH PROPOFOL;  Surgeon: Corbin Ade, MD;  [facility];  Service: Endoscopy;  Laterality: N/A;   MALONEY DILATION N/A 12/09/2022   Procedure: Elease Hashimoto DILATION;  Surgeon: Corbin Ade, MD;  [facility];  Service: Endoscopy;  Laterality: N/A;   POLYPECTOMY  12/09/2022   Procedure: POLYPECTOMY;  Surgeon: Corbin Ade, MD;  [facility];  Service: Endoscopy;;    OB History     Gravida  0   Para  0   Term  0   Preterm  0   AB  0   Living  0      SAB  0   IAB  0   Ectopic  0   Multiple  0   Live Births  0           Social History   Socioeconomic History   Marital status: Divorced    Spouse name: Not on file   Number of children: 0   Years of education: BS   Highest education level: Not on file  Occupational History   Occupation: Heritage manager  Tobacco Use   Smoking status: Never   Smokeless tobacco: Never  Vaping Use   Vaping Use: Never used  Substance and  Sexual Activity   Alcohol use: No   Drug use: No   Sexual activity: Not Currently    Birth control/protection: None  Other Topics Concern   Not on file  Social History Narrative   Left handed    Soda sometimes   Tea is rare   Social Determinants of Health   Financial Resource Strain: Medium Risk (04/14/2023)   Overall Financial Resource Strain (CARDIA)    Difficulty of Paying Living Expenses: Somewhat hard  Food Insecurity: Food Insecurity Present (04/14/2023)   Hunger Vital Sign    Worried About Running Out of Food in the Last Year: Sometimes true    Ran Out of Food in the Last Year: Sometimes true  Transportation Needs: No Transportation Needs (04/14/2023)   PRAPARE - Administrator, Civil Service (Medical): No    Lack of Transportation (Non-Medical): No  Physical Activity: Insufficiently Active (04/14/2023)   Exercise Vital Sign    Days of Exercise per Week: 1 day    Minutes of Exercise per Session: 60 min  Stress: No Stress Concern Present (04/14/2023)   [employer] of Occupational Health - Occupational Stress Questionnaire    Feeling of Stress : Not at all  Social Connections: Moderately Isolated (04/14/2023)  Social Advertising account executive [NHANES]    Frequency of Communication with Friends and Family: Twice a week    Frequency of Social Gatherings with Friends and Family: More than three times a week    Attends Religious Services: More than 4 times per year    Active Member of [employer] or Organizations: No    Attends Banker Meetings: Never    Marital Status: Divorced    Family History  Problem Relation Age of Onset   Stroke Mother    Breast cancer Mother    Diabetes Mother    Diabetes Father    Breast cancer Maternal Aunt    Cancer - Colon Neg Hx    Colon polyps Neg Hx      Current Outpatient Medications:    acetaminophen (TYLENOL) 500 MG tablet, Take 500-1,000 mg by mouth every 6 (six) hours as needed (pain.)., Disp: , Rfl:     atorvastatin (LIPITOR) 20 MG tablet, Take 20 mg by mouth daily., Disp: , Rfl:    cyclobenzaprine (FLEXERIL) 10 MG tablet, Take 10 mg by mouth daily as needed for muscle spasms., Disp: , Rfl:    famotidine (PEPCID) 20 MG tablet, Take 20 mg by mouth at bedtime., Disp: , Rfl:    fluticasone (FLONASE) 50 MCG/ACT nasal spray, Place 1 spray into both nostrils in the morning and at bedtime., Disp: , Rfl:    LINZESS 145 MCG CAPS capsule, Take 290 mcg by mouth every evening., Disp: , Rfl:    losartan (COZAAR) 50 MG tablet, Take 50 mg by mouth in the morning., Disp: , Rfl:    megestrol (MEGACE) 40 MG tablet, TAKE ONE TABLET BY MOUTH ONCE DAILY FOR 4 WEEKS.( TAKE 1 WEEK OFF THEN REPEAT) (Patient taking differently: Take 40 mg by mouth at bedtime.), Disp: 28 tablet, Rfl: 11   meloxicam (MOBIC) 15 MG tablet, Take 15 mg by mouth daily as needed (pain/inflammation.)., Disp: , Rfl:    montelukast (SINGULAIR) 10 MG tablet, Take 10 mg by mouth in the morning., Disp: , Rfl:    pantoprazole (PROTONIX) 40 MG tablet, Take 1 tablet (40 mg total) by mouth daily., Disp: 90 tablet, Rfl: 3  Review of Systems  Review of Systems  Constitutional: Negative for fever, chills, weight loss, malaise/fatigue and diaphoresis.  HENT: Negative for hearing loss, ear pain, nosebleeds, congestion, sore throat, neck pain, tinnitus and ear discharge.   Eyes: Negative for blurred vision, double vision, photophobia, pain, discharge and redness.  Respiratory: Negative for cough, hemoptysis, sputum production, shortness of breath, wheezing and stridor.   Cardiovascular: Negative for chest pain, palpitations, orthopnea, claudication, leg swelling and PND.  Gastrointestinal: negative for abdominal pain. Negative for heartburn, nausea, vomiting, diarrhea, constipation, blood in stool and melena.  Genitourinary: Negative for dysuria, urgency, frequency, hematuria and flank pain.  Musculoskeletal: Negative for myalgias, back pain, joint pain  and falls.  Skin: Negative for itching and rash.  Neurological: Negative for dizziness, tingling, tremors, sensory change, speech change, focal weakness, seizures, loss of consciousness, weakness and headaches.  Endo/Heme/Allergies: Negative for environmental allergies and polydipsia. Does not bruise/bleed easily.  Psychiatric/Behavioral: Negative for depression, suicidal ideas, hallucinations, memory loss and substance abuse. The patient is not nervous/anxious and does not have insomnia.        Objective:  Blood pressure 133/78, pulse (!) 109, height 5\' 2"  (1.575 m), weight 169 lb (76.7 kg).   Physical Exam  Vitals reviewed. Constitutional: She is oriented to person, place, and time. She appears  well-developed and well-nourished.  HENT:  Head: Normocephalic and atraumatic.        Right Ear: External ear normal.  Left Ear: External ear normal.  Nose: Nose normal.  Mouth/Throat: Oropharynx is clear and moist.  Eyes: Conjunctivae and EOM are normal. Pupils are equal, round, and reactive to light. Right eye exhibits no discharge. Left eye exhibits no discharge. No scleral icterus.  Neck: Normal range of motion. Neck supple. No tracheal deviation present. No thyromegaly present.  Cardiovascular: Normal rate, regular rhythm, normal heart sounds and intact distal pulses.  Exam reveals no gallop and no friction rub.   No murmur heard. Respiratory: Effort normal and breath sounds normal. No respiratory distress. She has no wheezes. She has no rales. She exhibits no tenderness.  GI: Soft. Bowel sounds are normal. She exhibits no distension and no mass. There is no tenderness. There is no rebound and no guarding.  Genitourinary:  Breasts no masses skin changes or nipple changes bilaterally      Vulva is normal without lesions Vagina is pink moist without discharge Cervix normal in appearance and pap is done Uterus is normal size shape and contour Adnexa is negative with normal sized ovaries   {Rectal    hemoccult negative, normal tone, no masses  Musculoskeletal: Normal range of motion. She exhibits no edema and no tenderness.  Neurological: She is alert and oriented to person, place, and time. She has normal reflexes. She displays normal reflexes. No cranial nerve deficit. She exhibits normal muscle tone. Coordination normal.  Skin: Skin is warm and dry. No rash noted. No erythema. No pallor.  Psychiatric: She has a normal mood and affect. Her behavior is normal. Judgment and thought content normal.       Medications Ordered at today's visit: No orders of the defined types were placed in this encounter.   Other orders placed at today's visit: No orders of the defined types were placed in this encounter.     Assessment:    Normal Gyn exam.    Plan:    Contraception: megace for menstrual management. Follow up in: 3 years.   Mammogram is encouraged/recommended  No follow-ups on file.

## 2023-04-15 LAB — CYTOLOGY - PAP
Comment: NEGATIVE
Diagnosis: NEGATIVE
High risk HPV: NEGATIVE

## 2023-07-14 ENCOUNTER — Encounter: Payer: Self-pay | Admitting: Gastroenterology

## 2024-04-18 ENCOUNTER — Ambulatory Visit: Admitting: Obstetrics & Gynecology

## 2024-04-20 ENCOUNTER — Ambulatory Visit (INDEPENDENT_AMBULATORY_CARE_PROVIDER_SITE_OTHER): Admitting: Obstetrics & Gynecology

## 2024-04-20 ENCOUNTER — Encounter: Payer: Self-pay | Admitting: Obstetrics & Gynecology

## 2024-04-20 VITALS — BP 133/87 | HR 114 | Ht 62.0 in | Wt 161.0 lb

## 2024-04-20 DIAGNOSIS — M545 Low back pain, unspecified: Secondary | ICD-10-CM

## 2024-04-20 DIAGNOSIS — Z01419 Encounter for gynecological examination (general) (routine) without abnormal findings: Secondary | ICD-10-CM

## 2024-04-20 LAB — POCT URINALYSIS DIPSTICK OB
Glucose, UA: NEGATIVE
Ketones, UA: NEGATIVE
Nitrite, UA: NEGATIVE
POC,PROTEIN,UA: NEGATIVE

## 2024-04-20 MED ORDER — MEGESTROL ACETATE 40 MG/ML PO SUSP: 40.0000 mg | Freq: Every day | ORAL | 11 refills | Status: AC

## 2024-04-20 NOTE — Progress Notes (Unsigned)
 Subjective:     Cheryl Graham is a 58 y.o. female here for a routine exam.  No LMP recorded. (Menstrual status: Irregular Periods). G0P0000 Birth Control Method:  PM Menstrual Calendar(currently):   Current complaints: back issues.   Current acute medical issues:     Recent Gynecologic History No LMP recorded. (Menstrual status: Irregular Periods). Last Pap: 2022,  normal Last mammogram: 2021,  normal  Past Medical History:  Diagnosis Date   Allergies    Anemia    H pylori ulcer    Hypercholesteremia    Joint pain    Neuropathy    Pre-diabetes     Past Surgical History:  Procedure Laterality Date   COLONOSCOPY WITH PROPOFOL  N/A 12/09/2022   Procedure: COLONOSCOPY WITH PROPOFOL ;  Surgeon: Shaaron Lamar HERO, MD;  Location: [facility];  Service: Endoscopy;  Laterality: N/A;  11:00am, asa 2   ESOPHAGOGASTRODUODENOSCOPY (EGD) WITH PROPOFOL  N/A 12/09/2022   Procedure: ESOPHAGOGASTRODUODENOSCOPY (EGD) WITH PROPOFOL ;  Surgeon: Shaaron Lamar HERO, MD;  Location: [facility];  Service: Endoscopy;  Laterality: N/A;   MALONEY DILATION N/A 12/09/2022   Procedure: AGAPITO DILATION;  Surgeon: Shaaron Lamar HERO, MD;  Location: [facility];  Service: Endoscopy;  Laterality: N/A;   POLYPECTOMY  12/09/2022   Procedure: POLYPECTOMY;  Surgeon: Shaaron Lamar HERO, MD;  Location: [facility];  Service: Endoscopy;;    OB History     Gravida  0   Para  0   Term  0   Preterm  0   AB  0   Living  0      SAB  0   IAB  0   Ectopic  0   Multiple  0   Live Births  0           Social History   Socioeconomic History   Marital status: Divorced    Spouse name: Not on file   Number of children: 0   Years of education: BS   Highest education level: Not on file  Occupational History   Occupation: Heritage manager  Tobacco Use   Smoking status: Never   Smokeless tobacco: Never  Vaping Use   Vaping status: Never Used  Substance and Sexual Activity   Alcohol use: No   Drug use: No    Sexual activity: Not Currently    Birth control/protection: None  Other Topics Concern   Not on file  Social History Narrative   Left handed    Soda sometimes   Tea is rare   Social Drivers of Corporate investment banker Strain: Medium Risk (04/14/2023)   Overall Financial Resource Strain (CARDIA)    Difficulty of Paying Living Expenses: Somewhat hard  Food Insecurity: No Food Insecurity (04/20/2024)   Hunger Vital Sign    Worried About Running Out of Food in the Last Year: Never true    Ran Out of Food in the Last Year: Never true  Transportation Needs: No Transportation Needs (04/20/2024)   PRAPARE - Administrator, Civil Service (Medical): No    Lack of Transportation (Non-Medical): No  Physical Activity: Insufficiently Active (04/20/2024)   Exercise Vital Sign    Days of Exercise per Week: 3 days    Minutes of Exercise per Session: 40 min  Stress: No Stress Concern Present (04/20/2024)   [employer] of Occupational Health - Occupational Stress Questionnaire    Feeling of Stress: Not at all  Social Connections: Moderately Integrated (04/20/2024)   Social  Connection and Isolation Panel    Frequency of Communication with Friends and Family: More than three times a week    Frequency of Social Gatherings with Friends and Family: More than three times a week    Attends Religious Services: More than 4 times per year    Active Member of [employer] or Organizations: Yes    Attends Engineer, structural: More than 4 times per year    Marital Status: Divorced    Family History  Problem Relation Age of Onset   Stroke Mother    Breast cancer Mother    Diabetes Mother    Diabetes Father    Breast cancer Maternal Aunt    Cancer - Colon Neg Hx    Colon polyps Neg Hx      Current Outpatient Medications:    acetaminophen  (TYLENOL ) 500 MG tablet, Take 500-1,000 mg by mouth every 6 (six) hours as needed (pain.)., Disp: , Rfl:    atorvastatin (LIPITOR) 20 MG tablet,  Take 20 mg by mouth daily., Disp: , Rfl:    cyclobenzaprine (FLEXERIL) 10 MG tablet, Take 10 mg by mouth daily as needed for muscle spasms., Disp: , Rfl:    famotidine (PEPCID) 20 MG tablet, Take 20 mg by mouth at bedtime., Disp: , Rfl:    fluticasone (FLONASE) 50 MCG/ACT nasal spray, Place 1 spray into both nostrils in the morning and at bedtime., Disp: , Rfl:    gabapentin (NEURONTIN) 300 MG capsule, Take 600 mg by mouth at bedtime., Disp: , Rfl:    LINZESS 145 MCG CAPS capsule, Take 290 mcg by mouth every evening., Disp: , Rfl:    losartan (COZAAR) 50 MG tablet, Take 50 mg by mouth in the morning., Disp: , Rfl:    megestrol  (MEGACE ) 40 MG/ML suspension, Take 1 mL (40 mg total) by mouth daily., Disp: 240 mL, Rfl: 11   meloxicam (MOBIC) 15 MG tablet, Take 15 mg by mouth daily as needed (pain/inflammation.)., Disp: , Rfl:    montelukast (SINGULAIR) 10 MG tablet, Take 10 mg by mouth in the morning., Disp: , Rfl:    pantoprazole  (PROTONIX ) 40 MG tablet, Take 1 tablet (40 mg total) by mouth daily., Disp: 90 tablet, Rfl: 3  Review of Systems  Review of Systems  Constitutional: Negative for fever, chills, weight loss, malaise/fatigue and diaphoresis.  HENT: Negative for hearing loss, ear pain, nosebleeds, congestion, sore throat, neck pain, tinnitus and ear discharge.   Eyes: Negative for blurred vision, double vision, photophobia, pain, discharge and redness.  Respiratory: Negative for cough, hemoptysis, sputum production, shortness of breath, wheezing and stridor.   Cardiovascular: Negative for chest pain, palpitations, orthopnea, claudication, leg swelling and PND.  Gastrointestinal: negative for abdominal pain. Negative for heartburn, nausea, vomiting, diarrhea, constipation, blood in stool and melena.  Genitourinary: Negative for dysuria, urgency, frequency, hematuria and flank pain.  Musculoskeletal: Negative for myalgias, back pain, joint pain and falls.  Skin: Negative for itching and rash.   Neurological: Negative for dizziness, tingling, tremors, sensory change, speech change, focal weakness, seizures, loss of consciousness, weakness and headaches.  Endo/Heme/Allergies: Negative for environmental allergies and polydipsia. Does not bruise/bleed easily.  Psychiatric/Behavioral: Negative for depression, suicidal ideas, hallucinations, memory loss and substance abuse. The patient is not nervous/anxious and does not have insomnia.        Objective:  Blood pressure 133/87, pulse (!) 114, height 5' 2 (1.575 m), weight 161 lb (73 kg).   Physical Exam  Vitals reviewed. Constitutional: She is oriented  to person, place, and time. She appears well-developed and well-nourished.  HENT:  Head: Normocephalic and atraumatic.        Right Ear: External ear normal.  Left Ear: External ear normal.  Nose: Nose normal.  Mouth/Throat: Oropharynx is clear and moist.  Eyes: Conjunctivae and EOM are normal. Pupils are equal, round, and reactive to light. Right eye exhibits no discharge. Left eye exhibits no discharge. No scleral icterus.  Neck: Normal range of motion. Neck supple. No tracheal deviation present. No thyromegaly present.  Cardiovascular: Normal rate, regular rhythm, normal heart sounds and intact distal pulses.  Exam reveals no gallop and no friction rub.   No murmur heard. Respiratory: Effort normal and breath sounds normal. No respiratory distress. She has no wheezes. She has no rales. She exhibits no tenderness.  GI: Soft. Bowel sounds are normal. She exhibits no distension and no mass. There is no tenderness. There is no rebound and no guarding.  Genitourinary:  Breasts no masses skin changes or nipple changes bilaterally      Vulva is normal without lesions Vagina is pink moist without discharge Cervix normal in appearance and pap is done Uterus is normal size shape and contour Adnexa is negative with normal sized ovaries   Musculoskeletal: Normal range of motion. She  exhibits no edema and no tenderness.  Neurological: She is alert and oriented to person, place, and time. She has normal reflexes. She displays normal reflexes. No cranial nerve deficit. She exhibits normal muscle tone. Coordination normal.  Skin: Skin is warm and dry. No rash noted. No erythema. No pallor.  Psychiatric: She has a normal mood and affect. Her behavior is normal. Judgment and thought content normal.       Medications Ordered at today's visit: Meds ordered this encounter  Medications   megestrol  (MEGACE ) 40 MG/ML suspension    Sig: Take 1 mL (40 mg total) by mouth daily.    Dispense:  240 mL    Refill:  11    Other orders placed at today's visit: Orders Placed This Encounter  Procedures   POC Urinalysis Dipstick OB     ASSESSMENT + PLAN:    ICD-10-CM   1. Well woman exam with routine gynecological exam  Z01.419     2. Left low back pain, unspecified chronicity, unspecified whether sciatica present  M54.50 POC Urinalysis Dipstick OB          No follow-ups on file.

## 2024-04-23 ENCOUNTER — Encounter: Payer: Self-pay | Admitting: Obstetrics & Gynecology
